# Patient Record
Sex: Female | Born: 1970
Health system: Southern US, Community
[De-identification: ages and names within clinical notes are randomized; demographics above are authoritative.]

## PROBLEM LIST (undated history)

## (undated) DIAGNOSIS — E039 Hypothyroidism, unspecified: Secondary | ICD-10-CM

## (undated) DIAGNOSIS — M549 Dorsalgia, unspecified: Secondary | ICD-10-CM

## (undated) DIAGNOSIS — J45909 Unspecified asthma, uncomplicated: Secondary | ICD-10-CM

## (undated) DIAGNOSIS — J449 Chronic obstructive pulmonary disease, unspecified: Secondary | ICD-10-CM

## (undated) DIAGNOSIS — D219 Benign neoplasm of connective and other soft tissue, unspecified: Secondary | ICD-10-CM

## (undated) DIAGNOSIS — E079 Disorder of thyroid, unspecified: Secondary | ICD-10-CM

## (undated) DIAGNOSIS — R200 Anesthesia of skin: Secondary | ICD-10-CM

## (undated) DIAGNOSIS — R202 Paresthesia of skin: Secondary | ICD-10-CM

## (undated) DIAGNOSIS — F319 Bipolar disorder, unspecified: Secondary | ICD-10-CM

## (undated) DIAGNOSIS — M1990 Unspecified osteoarthritis, unspecified site: Secondary | ICD-10-CM

## (undated) DIAGNOSIS — R569 Unspecified convulsions: Secondary | ICD-10-CM

## (undated) DIAGNOSIS — F32A Depression, unspecified: Secondary | ICD-10-CM

## (undated) DIAGNOSIS — F419 Anxiety disorder, unspecified: Secondary | ICD-10-CM

## (undated) DIAGNOSIS — F329 Major depressive disorder, single episode, unspecified: Secondary | ICD-10-CM

## (undated) HISTORY — DX: Anesthesia of skin: R20.0

## (undated) HISTORY — DX: Bipolar disorder, unspecified: F31.9

## (undated) HISTORY — DX: Chronic obstructive pulmonary disease, unspecified: J44.9

## (undated) HISTORY — DX: Anesthesia of skin: R20.2

## (undated) HISTORY — DX: Disorder of thyroid, unspecified: E07.9

## (undated) HISTORY — DX: Anxiety disorder, unspecified: F41.9

## (undated) HISTORY — DX: Benign neoplasm of connective and other soft tissue, unspecified: D21.9

## (undated) HISTORY — PX: CERVICAL CONE BIOPSY: SUR198

---

## 2009-04-14 ENCOUNTER — Emergency Department (HOSPITAL_COMMUNITY): Admission: EM | Admit: 2009-04-14 | Discharge: 2009-04-14 | Payer: Self-pay | Admitting: Emergency Medicine

## 2009-06-07 ENCOUNTER — Emergency Department (HOSPITAL_COMMUNITY): Admission: EM | Admit: 2009-06-07 | Discharge: 2009-06-07 | Payer: Self-pay | Admitting: Emergency Medicine

## 2011-08-04 ENCOUNTER — Emergency Department (HOSPITAL_COMMUNITY)
Admission: EM | Admit: 2011-08-04 | Discharge: 2011-08-05 | Disposition: A | Discharge status: Home / Self Care | Payer: Medicaid Other | Attending: Emergency Medicine | Admitting: Emergency Medicine

## 2011-08-04 ENCOUNTER — Encounter (HOSPITAL_COMMUNITY): Payer: Self-pay | Admitting: *Deleted

## 2011-08-04 DIAGNOSIS — T50902A Poisoning by unspecified drugs, medicaments and biological substances, intentional self-harm, initial encounter: Secondary | ICD-10-CM

## 2011-08-04 DIAGNOSIS — F329 Major depressive disorder, single episode, unspecified: Secondary | ICD-10-CM | POA: Insufficient documentation

## 2011-08-04 DIAGNOSIS — F3289 Other specified depressive episodes: Secondary | ICD-10-CM | POA: Insufficient documentation

## 2011-08-04 DIAGNOSIS — Y92009 Unspecified place in unspecified non-institutional (private) residence as the place of occurrence of the external cause: Secondary | ICD-10-CM | POA: Insufficient documentation

## 2011-08-04 DIAGNOSIS — T50992A Poisoning by other drugs, medicaments and biological substances, intentional self-harm, initial encounter: Secondary | ICD-10-CM | POA: Insufficient documentation

## 2011-08-04 DIAGNOSIS — T50991A Poisoning by other drugs, medicaments and biological substances, accidental (unintentional), initial encounter: Secondary | ICD-10-CM | POA: Insufficient documentation

## 2011-08-04 HISTORY — DX: Major depressive disorder, single episode, unspecified: F32.9

## 2011-08-04 HISTORY — DX: Depression, unspecified: F32.A

## 2011-08-04 HISTORY — DX: Unspecified asthma, uncomplicated: J45.909

## 2011-08-04 LAB — COMPREHENSIVE METABOLIC PANEL
ALT: 11 U/L (ref 0–35)
CO2: 23 mEq/L (ref 19–32)
Calcium: 9.5 mg/dL (ref 8.4–10.5)
GFR calc Af Amer: >90 mL/min (ref >90)
GFR calc non Af Amer: >90 mL/min (ref >90)
Glucose, Bld: 96 mg/dL (ref 70–99)
Sodium: 137 mEq/L (ref 135–145)

## 2011-08-04 LAB — URINALYSIS, ROUTINE W REFLEX MICROSCOPIC
Bilirubin Urine: NEGATIVE
Glucose, UA: NEGATIVE mg/dL
Hgb urine dipstick: NEGATIVE
Ketones, ur: NEGATIVE mg/dL
Leukocytes, UA: NEGATIVE
Nitrite: NEGATIVE
Protein, ur: NEGATIVE mg/dL
Specific Gravity, Urine: <1.005 — ABNORMAL LOW (ref 1.005–1.030)
Urobilinogen, UA: 0.2 mg/dL (ref 0.0–1.0)
pH: 6 (ref 5.0–8.0)

## 2011-08-04 LAB — ETHANOL: Alcohol, Ethyl (B): <11 mg/dL (ref 0–11)

## 2011-08-04 LAB — ACETAMINOPHEN LEVEL: Acetaminophen (Tylenol), Serum: <15 ug/mL (ref 10–30)

## 2011-08-04 LAB — PROTIME-INR: Prothrombin Time: 14.2 seconds (ref 11.6–15.2)

## 2011-08-04 LAB — DIFFERENTIAL
Eosinophils Absolute: 0.2 10*3/uL (ref 0.0–0.7)
Lymphs Abs: 3 10*3/uL (ref 0.7–4.0)
Monocytes Absolute: 0.3 10*3/uL (ref 0.1–1.0)
Monocytes Relative: 5 % (ref 3–12)
Neutro Abs: 2 10*3/uL (ref 1.7–7.7)
Neutrophils Relative %: 37 % — ABNORMAL LOW (ref 43–77)

## 2011-08-04 LAB — PREGNANCY, URINE: Preg Test, Ur: NEGATIVE

## 2011-08-04 LAB — RAPID URINE DRUG SCREEN, HOSP PERFORMED
Barbiturates: NOT DETECTED
Cocaine: NOT DETECTED

## 2011-08-04 LAB — SALICYLATE LEVEL: Salicylate Lvl: 8.1 mg/dL (ref 2.8–20.0)

## 2011-08-04 LAB — CBC
HCT: 41.9 % (ref 36.0–46.0)
WBC: 5.6 10*3/uL (ref 4.0–10.5)

## 2011-08-04 LAB — APTT: aPTT: 27 s (ref 24–37)

## 2011-08-04 NOTE — ED Provider Notes (Signed)
History     CSN: [id number]  Arrival date & time 08/04/11  1639   First MD Initiated Contact with Patient 08/04/11 1650      Chief Complaint  Patient presents with  . Medical Clearance     HPI Pt was seen at 1705.   Per pt, c/o sudden onset and resolution of one episode of OD on meds PTA.  Pt states her "daughter got to me" and "I got upset," so she drank 2 beers, took OTC motrin 200mg  tabs approx 6-10 tabs and OTC excedrin migraine approx 6 tablets.  States she "immediately realized what I just did" and "made myself throw them all up."  States she believes "it was a stupid, embarrassing thing to do."  Denies hx of SI or SA previously.  Does endorse Hx anxiety and depression, sees a Theatre manager at [facility].  Denies SI currently, no HI, no abd pain, no CP/SOB.     Past Medical History  Diagnosis Date  . Asthma   . Depression     History reviewed. No pertinent past surgical history.   History  Substance Use Topics  . Smoking status: Current Everyday Smoker  . Smokeless tobacco: Not on file  . Alcohol Use: Yes    Review of Systems ROS: Statement: All systems negative except as marked or noted in the HPI; Constitutional: Negative for fever and chills. ; ; Eyes: Negative for eye pain, redness and discharge. ; ; ENMT: Negative for ear pain, hoarseness, nasal congestion, sinus pressure and sore throat. ; ; Cardiovascular: Negative for chest pain, palpitations, diaphoresis, dyspnea and peripheral edema. ; ; Respiratory: Negative for cough, wheezing and stridor. ; ; Gastrointestinal: Negative for nausea, vomiting, diarrhea, abdominal pain, blood in stool, hematemesis, jaundice and rectal bleeding. . ; ; Genitourinary: Negative for dysuria, flank pain and hematuria. ; ; Musculoskeletal: Negative for back pain and neck pain. Negative for swelling and trauma.; ; Skin: Negative for pruritus, rash, abrasions, blisters, bruising and skin lesion.; ; Neuro: Negative for headache,  lightheadedness and neck stiffness. Negative for weakness, altered level of consciousness , altered mental status, extremity weakness, paresthesias, involuntary movement, seizure and syncope.; Psych:  +depression/anxiety, no HI, no hallucinations.     Allergies  Review of patient's allergies indicates no known allergies.  Home Medications  No current outpatient prescriptions on file.  BP 144/90  Pulse 95  Temp 98.7 F (37.1 C) (Oral)  Resp 20  Ht 5\' 8"  (1.727 m)  Wt 200 lb (90.719 kg)  BMI 30.41 kg/m2  SpO2 96%  Physical Exam 1710: Physical examination:  Nursing notes reviewed; Vital signs and O2 SAT reviewed;  Constitutional: Well developed, Well nourished, Well hydrated, In no acute distress; Head:  Normocephalic, atraumatic; Eyes: EOMI, PERRL, No scleral icterus; ENMT: Mouth and pharynx normal, Mucous membranes moist; Neck: Supple, Full range of motion, No lymphadenopathy; Cardiovascular: Regular rate and rhythm, No murmur, rub, or gallop; Respiratory: Breath sounds clear & equal bilaterally, No rales, rhonchi, wheezes.  Speaking full sentences with ease, Normal respiratory effort/excursion; Chest: Nontender, Movement normal;; Extremities: Pulses normal, No tenderness, No edema, No calf edema or asymmetry.; Neuro: AA&Ox3, Major CN grossly intact.  Speech clear. No gross focal motor or sensory deficits in extremities.; Skin: Color normal, Warm, Dry; Psych:  Full affect, denies SI.    ED Course  Procedures   MDM  MDM Reviewed: nursing note and vitals Interpretation: labs     Results for orders placed during the hospital encounter of 08/04/11  CBC      Component Value Range   WBC 5.6  4.0 - 10.5 K/uL   RBC 4.68  3.87 - 5.11 MIL/uL   Hemoglobin 13.7  12.0 - 15.0 g/dL   HCT 29.5  28.4 - 13.2 %   MCV 89.5  78.0 - 100.0 fL   MCH 29.3  26.0 - 34.0 pg   MCHC 32.7  30.0 - 36.0 g/dL   RDW 44.0  10.2 - 72.5 %   Platelets 235  150 - 400 K/uL  DIFFERENTIAL      Component Value  Range   Neutrophils Relative 37 (*) 43 - 77 %   Neutro Abs 2.0  1.7 - 7.7 K/uL   Lymphocytes Relative 54 (*) 12 - 46 %   Lymphs Abs 3.0  0.7 - 4.0 K/uL   Monocytes Relative 5  3 - 12 %   Monocytes Absolute 0.3  0.1 - 1.0 K/uL   Eosinophils Relative 4  0 - 5 %   Eosinophils Absolute 0.2  0.0 - 0.7 K/uL   Basophils Relative 0  0 - 1 %   Basophils Absolute 0.0  0.0 - 0.1 K/uL  COMPREHENSIVE METABOLIC PANEL      Component Value Range   Sodium 137  135 - 145 mEq/L   Potassium 3.7  3.5 - 5.1 mEq/L   Chloride 101  96 - 112 mEq/L   CO2 23  19 - 32 mEq/L   Glucose, Bld 96  70 - 99 mg/dL   BUN 11  6 - 23 mg/dL   Creatinine, Ser 3.66  0.50 - 1.10 mg/dL   Calcium 9.5  8.4 - 44.0 mg/dL   Total Protein 7.3  6.0 - 8.3 g/dL   Albumin 3.5  3.5 - 5.2 g/dL   AST 23  0 - 37 U/L   ALT 11  0 - 35 U/L   Alkaline Phosphatase 77  39 - 117 U/L   Total Bilirubin 0.2 (*) 0.3 - 1.2 mg/dL   GFR calc non Af Amer >90  >90 mL/min   GFR calc Af Amer >90  >90 mL/min  ACETAMINOPHEN LEVEL      Component Value Range   Acetaminophen (Tylenol), Serum <15.0  10 - 30 ug/mL  URINE RAPID DRUG SCREEN (HOSP PERFORMED)      Component Value Range   Opiates NONE DETECTED  NONE DETECTED   Cocaine NONE DETECTED  NONE DETECTED   Benzodiazepines NONE DETECTED  NONE DETECTED   Amphetamines NONE DETECTED  NONE DETECTED   Tetrahydrocannabinol NONE DETECTED  NONE DETECTED   Barbiturates NONE DETECTED  NONE DETECTED  ETHANOL      Component Value Range   Alcohol, Ethyl (B) <11  0 - 11 mg/dL  PREGNANCY, URINE      Component Value Range   Preg Test, Ur NEGATIVE  NEGATIVE  URINALYSIS, ROUTINE W REFLEX MICROSCOPIC      Component Value Range   Color, Urine YELLOW  YELLOW   APPearance CLEAR  CLEAR   Specific Gravity, Urine <1.005 (*) 1.005 - 1.030   pH 6.0  5.0 - 8.0   Glucose, UA NEGATIVE  NEGATIVE mg/dL   Hgb urine dipstick NEGATIVE  NEGATIVE   Bilirubin Urine NEGATIVE  NEGATIVE   Ketones, ur NEGATIVE  NEGATIVE mg/dL    Protein, ur NEGATIVE  NEGATIVE mg/dL   Urobilinogen, UA 0.2  0.0 - 1.0 mg/dL   Nitrite NEGATIVE  NEGATIVE   Leukocytes, UA NEGATIVE  NEGATIVE  SALICYLATE  LEVEL      Component Value Range   Salicylate Lvl 8.1  2.8 - 20.0 mg/dL  PROTIME-INR      Component Value Range   Prothrombin Time 14.2  11.6 - 15.2 seconds   INR 1.08  0.00 - 1.49  APTT      Component Value Range   aPTT 27  24 - 37 seconds      2210:  Pt continues calm/cooperative.  4 hour labs (etoh, APAP, ASA) normal.  Pt did not take extended release or enteric coated preparations, though will order re-check of APAP and ASA in 2 hours for trending.  Will have telepsych eval for likely d/c.  Pt agreeable with plan. Sign out to night shift.             Laray Anger, DO 08/07/11 1454

## 2011-08-04 NOTE — ED Notes (Signed)
Dr. Clarene Duke in to speak with patient and explain reason for lab draws at 21:00.  Patient acknowledged explanation.  Food offered- meal tray accepted.  Patient calm and cooperative at this time.

## 2011-08-04 NOTE — ED Notes (Addendum)
Pt calm and cooperative at present. Pt states that she did not want to harm herself only wanted her headache to go away

## 2011-08-04 NOTE — ED Notes (Signed)
Took excedrin migraine   6 tablets and   Motrin 200mg  ,   6-10.  Drank  2 beers

## 2011-08-05 MED ORDER — PENICILLIN V POTASSIUM 500 MG PO TABS: 500.0000 mg | ORAL_TABLET | Freq: Four times a day (QID) | ORAL | Status: AC

## 2011-08-05 NOTE — ED Notes (Signed)
Consult completed.  Patient requested gingerale.   Provided.  Is coughing frequently. States she usually uses an inhaler but unable to find it.

## 2011-08-05 NOTE — ED Notes (Signed)
Telepsych consult in progress

## 2011-08-05 NOTE — Discharge Instructions (Signed)
Follow up with your doctor regarding your depression.

## 2011-08-05 NOTE — ED Notes (Signed)
Patient requested antibiotic for infected tooth - that is one of the causes of her pain which initiated her taking OTC pain meds (and other family issues)  Dr Colon Branch informed.

## 2011-08-05 NOTE — Progress Notes (Signed)
1610 Nursing advised that patient is asking for antibiotic for tooth that has become infected. Patient with lower right tooth with swollen gums, no pointed abscess.  Rx Pen VK #[phone]

## 2011-08-05 NOTE — Progress Notes (Signed)
28 Spoke with Dr. Rosette Reveal, psychiatrist with telepsych. He will evaluate patient. 1610 Report from Dr. Rosette Reveal. He has assessed patient and feels she can be discharged home safely.

## 2012-11-16 ENCOUNTER — Encounter (HOSPITAL_COMMUNITY): Payer: Self-pay | Admitting: *Deleted

## 2012-11-16 ENCOUNTER — Emergency Department (HOSPITAL_COMMUNITY)
Admission: EM | Admit: 2012-11-16 | Discharge: 2012-11-16 | Disposition: A | Discharge status: Home / Self Care | Payer: Medicaid Other | Attending: Emergency Medicine | Admitting: Emergency Medicine

## 2012-11-16 DIAGNOSIS — F17200 Nicotine dependence, unspecified, uncomplicated: Secondary | ICD-10-CM | POA: Insufficient documentation

## 2012-11-16 DIAGNOSIS — F3289 Other specified depressive episodes: Secondary | ICD-10-CM | POA: Insufficient documentation

## 2012-11-16 DIAGNOSIS — Z30432 Encounter for removal of intrauterine contraceptive device: Secondary | ICD-10-CM | POA: Insufficient documentation

## 2012-11-16 DIAGNOSIS — Z79899 Other long term (current) drug therapy: Secondary | ICD-10-CM | POA: Insufficient documentation

## 2012-11-16 DIAGNOSIS — R109 Unspecified abdominal pain: Secondary | ICD-10-CM

## 2012-11-16 DIAGNOSIS — J45909 Unspecified asthma, uncomplicated: Secondary | ICD-10-CM | POA: Insufficient documentation

## 2012-11-16 DIAGNOSIS — F329 Major depressive disorder, single episode, unspecified: Secondary | ICD-10-CM | POA: Insufficient documentation

## 2012-11-16 DIAGNOSIS — IMO0002 Reserved for concepts with insufficient information to code with codable children: Secondary | ICD-10-CM | POA: Insufficient documentation

## 2012-11-16 LAB — URINALYSIS, ROUTINE W REFLEX MICROSCOPIC
Bilirubin Urine: NEGATIVE
Glucose, UA: NEGATIVE mg/dL
Ketones, ur: NEGATIVE mg/dL
Leukocytes, UA: NEGATIVE
Nitrite: NEGATIVE
Protein, ur: NEGATIVE mg/dL
Specific Gravity, Urine: >1.03 — ABNORMAL HIGH (ref 1.005–1.030)
Urobilinogen, UA: 0.2 mg/dL (ref 0.0–1.0)
pH: 6 (ref 5.0–8.0)

## 2012-11-16 LAB — URINE MICROSCOPIC-ADD ON

## 2012-11-16 MED ORDER — NAPROXEN 500 MG PO TABS: 500.0000 mg | ORAL_TABLET | Freq: Two times a day (BID) | ORAL | Status: DC

## 2012-11-16 NOTE — ED Notes (Signed)
Pt states she needs IUD removed because it is time to come and states the IUD is causing abdominal pain. [facility] but they stated they were unable to take the IUD out for her.

## 2012-11-16 NOTE — ED Provider Notes (Signed)
CSN: [id number]     Arrival date & time 11/16/12  1306 History   First MD Initiated Contact with Patient 11/16/12 1324     Chief Complaint  Patient presents with  . IUD removal    (Consider location/radiation/quality/duration/timing/severity/associated sxs/prior Treatment) HPI Comments: 42 year old female who states that she has had an IUD for over 5 years, states that at this time for it to come out and has been unable to have a physician see her to do this since moving here from [place]. She describes approximately one month of lower abdominal discomfort which is intermittent, seems to be better when she holds her son.  She denies fevers chills dysuria but has had intermittent diarrhea and constipation.  The history is provided by the patient.    Past Medical History  Diagnosis Date  . Asthma   . Depression    History reviewed. No pertinent past surgical history. No family history on file. History  Substance Use Topics  . Smoking status: Current Every Day Smoker  . Smokeless tobacco: Not on file  . Alcohol Use: Yes   OB History   Grav Para Term Preterm Abortions TAB SAB Ect Mult Living                 Review of Systems  All other systems reviewed and are negative.    Allergies  Chantix  Home Medications   Current Outpatient Rx  Name  Route  Sig  Dispense  Refill  . albuterol (PROAIR HFA) 108 (90 BASE) MCG/ACT inhaler   Inhalation   Inhale 2 puffs into the lungs every 6 (six) hours as needed.         . ALPRAZolam (XANAX) 0.5 MG tablet   Oral   Take 0.5 mg by mouth 4 (four) times daily as needed.         . beclomethasone (QVAR) 40 MCG/ACT inhaler   Inhalation   Inhale 2 puffs into the lungs 2 (two) times daily.         . Chlorphen-Phenyleph-ASA (ALKA-SELTZER PLUS COLD PO)   Oral   Take 2 capsules by mouth at bedtime as needed (for allergies).         Marland Kitchen PARoxetine (PAXIL) 20 MG tablet   Oral   Take 20 mg by mouth every morning.         .  naproxen (NAPROSYN) 500 MG tablet   Oral   Take 1 tablet (500 mg total) by mouth 2 (two) times daily with a meal.   30 tablet   0    BP 146/77  Pulse 94  Temp(Src) 98.9 F (37.2 C) (Oral)  Resp 18  Ht 5\' 8"  (1.727 m)  Wt 210 lb (95.255 kg)  BMI 31.94 kg/m2  SpO2 95%  LMP 09/16/2012 Physical Exam  Nursing note and vitals reviewed. Constitutional: She appears well-developed and well-nourished. No distress.  HENT:  Head: Normocephalic and atraumatic.  Mouth/Throat: Oropharynx is clear and moist. No oropharyngeal exudate.  Eyes: Conjunctivae and EOM are normal. Pupils are equal, round, and reactive to light. Right eye exhibits no discharge. Left eye exhibits no discharge. No scleral icterus.  Neck: Normal range of motion. Neck supple. No JVD present. No thyromegaly present.  Cardiovascular: Normal rate, regular rhythm, normal heart sounds and intact distal pulses.  Exam reveals no gallop and no friction rub.   No murmur heard. Pulmonary/Chest: Effort normal and breath sounds normal. No respiratory distress. She has no wheezes. She has no rales.  Abdominal:  Soft. Bowel sounds are normal. She exhibits no distension and no mass. There is no tenderness.  Genitourinary:  Chaperone present for pelvic exam:  Normal appearing external genitalia, no vaginal discharge, no vaginal bleeding, no cervical motion tenderness, no adnexal tenderness or masses. IUD string is present coming from the cervix.  Musculoskeletal: Normal range of motion. She exhibits no edema and no tenderness.  Lymphadenopathy:    She has no cervical adenopathy.  Neurological: She is alert. Coordination normal.  Skin: Skin is warm and dry. No rash noted. No erythema.  Psychiatric: She has a normal mood and affect. Her behavior is normal.    ED Course  Procedures (including critical care time) Labs Review Labs Reviewed  URINALYSIS, ROUTINE W REFLEX MICROSCOPIC - Abnormal; Notable for the following:    Specific Gravity,  Urine >1.030 (*)    Hgb urine dipstick TRACE (*)    All other components within normal limits  URINE MICROSCOPIC-ADD ON - Abnormal; Notable for the following:    Squamous Epithelial / LPF FEW (*)    All other components within normal limits   Imaging Review No results found.  MDM   1. Abdominal pain    The patient is well appearing with normal vital signs, afebrile, no tachycardia and a soft nontender abdomen with no surgical signs.  The patient requests IUD removal. I've explained to her at length that this is not an emergency Department procedure but that we can do it in cases where he cannot be done at other times. She has expressed her understanding and agrees that she wants to move forward with this despite the risks.  Procedure note: IUD removal  Risks benefits and alternatives of the procedure were splinted the patient, she expresses her verbal understanding and excepts the risks.  Patient placed in the supine position, under speculum exam IUD removed with ring forceps, small amount of post removal bleeding, patient tolerated procedure without difficulty.  Urinalysis pending to evaluate for infection.    Vida Roller, MD 11/16/12 [phone]

## 2012-11-16 NOTE — ED Notes (Signed)
EDP to room for initial evaluation and assessment.

## 2013-02-13 ENCOUNTER — Encounter (HOSPITAL_COMMUNITY): Payer: Self-pay | Admitting: Emergency Medicine

## 2013-02-13 ENCOUNTER — Emergency Department (HOSPITAL_COMMUNITY)
Admission: EM | Admit: 2013-02-13 | Discharge: 2013-02-13 | Disposition: A | Discharge status: Home / Self Care | Payer: Medicaid Other | Attending: Emergency Medicine | Admitting: Emergency Medicine

## 2013-02-13 DIAGNOSIS — L02411 Cutaneous abscess of right axilla: Secondary | ICD-10-CM

## 2013-02-13 DIAGNOSIS — IMO0002 Reserved for concepts with insufficient information to code with codable children: Secondary | ICD-10-CM | POA: Insufficient documentation

## 2013-02-13 DIAGNOSIS — Z792 Long term (current) use of antibiotics: Secondary | ICD-10-CM | POA: Insufficient documentation

## 2013-02-13 DIAGNOSIS — Z791 Long term (current) use of non-steroidal anti-inflammatories (NSAID): Secondary | ICD-10-CM | POA: Insufficient documentation

## 2013-02-13 DIAGNOSIS — Z79899 Other long term (current) drug therapy: Secondary | ICD-10-CM | POA: Insufficient documentation

## 2013-02-13 DIAGNOSIS — F17200 Nicotine dependence, unspecified, uncomplicated: Secondary | ICD-10-CM | POA: Insufficient documentation

## 2013-02-13 DIAGNOSIS — F3289 Other specified depressive episodes: Secondary | ICD-10-CM | POA: Insufficient documentation

## 2013-02-13 DIAGNOSIS — F329 Major depressive disorder, single episode, unspecified: Secondary | ICD-10-CM | POA: Insufficient documentation

## 2013-02-13 DIAGNOSIS — J45909 Unspecified asthma, uncomplicated: Secondary | ICD-10-CM | POA: Insufficient documentation

## 2013-02-13 MED ORDER — OXYCODONE-ACETAMINOPHEN 5-325 MG PO TABS: 1.0000 | ORAL_TABLET | Freq: Four times a day (QID) | ORAL | Status: DC | PRN

## 2013-02-13 MED ORDER — HYDROMORPHONE HCL PF 1 MG/ML IJ SOLN
1.0000 mg | Freq: Once | INTRAMUSCULAR | Status: AC
  Administered 2013-02-13: 1 mg via INTRAMUSCULAR
  Filled 2013-02-13: qty 1

## 2013-02-13 MED ORDER — LIDOCAINE-EPINEPHRINE (PF) 2 %-1:200000 IJ SOLN
INTRAMUSCULAR | Status: AC
  Administered 2013-02-13: 20 mL
  Filled 2013-02-13: qty 20

## 2013-02-13 MED ORDER — DOXYCYCLINE HYCLATE 100 MG PO CAPS: 100.0000 mg | ORAL_CAPSULE | Freq: Two times a day (BID) | ORAL | Status: DC

## 2013-02-13 NOTE — ED Notes (Signed)
Pt ambulating independently w/ steady gait on d/c in no acute distress, A&Ox4. D/c instructions reviewed w/ pt and family - pt and family deny any further questions or concerns at present. Rx given x2  

## 2013-02-13 NOTE — ED Notes (Signed)
Pt reports noting a lump under her right axilla x2 weeks ago - area has become enlarged and is quite painful and extremely tender to touch. Pt denies any n/v, fever or chills.

## 2013-02-13 NOTE — ED Notes (Signed)
Onset of skin infection two weeks ago under right arm, worse tonight - feels very "tight and painful"   Took a hot bath and put "baby oil on it but it still hurts.  Also have one under left arm but it is getting better

## 2013-02-13 NOTE — ED Provider Notes (Addendum)
CSN: [id number]     Arrival date & time 02/13/13  2047 History   First MD Initiated Contact with Patient 02/13/13 2134 This chart was scribed for [employer]. Rubin Payor, MD by Elveria Rising, ED scribe.  This patient was seen in room APA19/APA19 and the patient's care was started at 9:42 PM.   Chief Complaint  Patient presents with  . Recurrent Skin Infections    under arm and painful    The history is provided by the patient. No language interpreter was used.   HPI Comments: Dawn Carter is a 42 y.o. female who presents to the Emergency Department w/hx of recurrent abscesses complaining of constant, gradually worsening abscess, right axilla, onset two weeks ago. The pain was previously tolerable, but worsened tonight. Patient attempted to treat with a warm compress, but it has been ineffective. Pain severity worsens on exertion. Patient denies fever. Patient also has abscess to left axilla that is healing. Patient denies diabetes. Patient is asthmatic. Patient has not been taking steroids.   Past Medical History  Diagnosis Date  . Asthma   . Depression    History reviewed. No pertinent past surgical history. No family history on file. History  Substance Use Topics  . Smoking status: Current Every Day Smoker  . Smokeless tobacco: Not on file  . Alcohol Use: Yes     Comment: beer   OB History   Grav Para Term Preterm Abortions TAB SAB Ect Mult Living                 Review of Systems  Constitutional: Negative for fever and chills.  HENT: Negative for congestion and rhinorrhea.   Respiratory: Negative for cough and shortness of breath.   Cardiovascular: Negative for chest pain.  Gastrointestinal: Negative for nausea and abdominal pain.  Musculoskeletal: Negative for back pain.  Skin: Positive for wound.       Abscess to right axilla.   Neurological: Negative for syncope.  All other systems reviewed and are negative.   A complete 10 system review of systems was obtained and  all systems are negative except as noted in the HPI and PMH.   Allergies  Chantix  Home Medications   Current Outpatient Rx  Name  Route  Sig  Dispense  Refill  . albuterol (PROAIR HFA) 108 (90 BASE) MCG/ACT inhaler   Inhalation   Inhale 2 puffs into the lungs every 6 (six) hours as needed for wheezing or shortness of breath.          . ALPRAZolam (XANAX) 1 MG tablet   Oral   Take 1 mg by mouth 3 (three) times daily as needed for anxiety.         . beclomethasone (QVAR) 40 MCG/ACT inhaler   Inhalation   Inhale 1 puff into the lungs 2 (two) times daily.          Marland Kitchen ibuprofen (ADVIL,MOTRIN) 200 MG tablet   Oral   Take 200 mg by mouth daily as needed for headache, mild pain, moderate pain or cramping.         . naproxen (NAPROSYN) 500 MG tablet   Oral   Take 1 tablet (500 mg total) by mouth 2 (two) times daily with a meal.   30 tablet   0   . naproxen (NAPROSYN) 500 MG tablet   Oral   Take 500-1,000 mg by mouth daily as needed for mild pain or moderate pain (for pain).         Marland Kitchen  Phenyleph-CPM-DM-Aspirin (ALKA-SELTZER PLUS COLD & COUGH) 7.09-16-08-325 MG TBEF   Oral   Take 1 tablet by mouth daily as needed (for cold symptoms).         Marland Kitchen doxycycline (VIBRAMYCIN) 100 MG capsule   Oral   Take 1 capsule (100 mg total) by mouth 2 (two) times daily.   14 capsule   0   . oxyCODONE-acetaminophen (PERCOCET/ROXICET) 5-325 MG per tablet   Oral   Take 1-2 tablets by mouth every 6 (six) hours as needed for severe pain.   15 tablet   0    Triage Vitals: BP 148/57  Pulse 87  Temp(Src) 98.3 F (36.8 C) (Oral)  Resp 20  Ht 5\' 7"  (1.702 m)  Wt 224 lb (101.606 kg)  BMI 35.08 kg/m2  SpO2 98%  LMP 02/12/2013 Physical Exam  Nursing note and vitals reviewed. Constitutional: She is oriented to person, place, and time. She appears well-developed and well-nourished. No distress.  HENT:  Head: Normocephalic and atraumatic.  Eyes: Conjunctivae are normal. Right eye  exhibits no discharge. Left eye exhibits no discharge.  Neck: Normal range of motion.  Cardiovascular: Normal rate, regular rhythm and normal heart sounds.   No murmur heard. Pulmonary/Chest: Effort normal and breath sounds normal. No respiratory distress. She has no wheezes. She has no rales.  Abdominal: Soft. Bowel sounds are normal. She exhibits no distension. There is no tenderness.  Musculoskeletal: Normal range of motion. She exhibits no edema.  Neurological: She is alert and oriented to person, place, and time.  Skin: Skin is warm and dry.  7cm x 3cm abscess right axilla, foul smelling drainage, tenderness on palpation, pain with movement of right shoulder.   Healing to left axilla, without tenderness  Psychiatric: She has a normal mood and affect. Thought content normal.    ED Course  Procedures (including critical care time) DIAGNOSTIC STUDIES: Oxygen Saturation is 98% on room air, normal by my interpretation.    COORDINATION OF CARE: 10:45 PM- Discussed plans of injection and pain medication. Pt advised of plan for treatment and pt agrees.  Labs Review Labs Reviewed - No data to display Imaging Review No results found.  EKG Interpretation   None      MDM   1. Abscess of axilla, right    Patient with right axillary abscess. Drained with moderate purulent drainage. Patient refused any packing. She also refused further opening up the initial incision. We'll treat with antibiotics and pain medicines. She'll followup with her primary care doctor in a few days. She will soak the abscess and attempt to express further purulence at home.   I personally performed the services described in this documentation, which was scribed in my presence. The recorded information has been reviewed and is accurate.   INCISION AND DRAINAGE Performed by: Billee Cashing. Consent: Verbal consent obtained. Risks and benefits: risks, benefits and alternatives were discussed Type:  abscess  Body area: Right axilla  Anesthesia: local infiltration  Incision was made with a scalpel.  Local anesthetic: lidocaine 2% with epinephrine  Anesthetic total: 20 ml including irrigation of wound.  Complexity: complex Blunt dissection to break up loculations  Drainage: purulent  Drainage amount: Moderate   Packing material: Refused   Patient tolerance: Patient tolerated the procedure well with no immediate complications.     Juliet Rude. Rubin Payor, MD 02/13/13 2246  Juliet Rude. Rubin Payor, MD 02/13/13 2252

## 2013-03-05 ENCOUNTER — Emergency Department (HOSPITAL_COMMUNITY)
Admission: EM | Admit: 2013-03-05 | Discharge: 2013-03-06 | Disposition: A | Discharge status: Home / Self Care | Payer: Medicaid Other | Attending: Emergency Medicine | Admitting: Emergency Medicine

## 2013-03-05 ENCOUNTER — Encounter (HOSPITAL_COMMUNITY): Payer: Self-pay | Admitting: Emergency Medicine

## 2013-03-05 DIAGNOSIS — Z79899 Other long term (current) drug therapy: Secondary | ICD-10-CM | POA: Insufficient documentation

## 2013-03-05 DIAGNOSIS — Z791 Long term (current) use of non-steroidal anti-inflammatories (NSAID): Secondary | ICD-10-CM | POA: Insufficient documentation

## 2013-03-05 DIAGNOSIS — F17200 Nicotine dependence, unspecified, uncomplicated: Secondary | ICD-10-CM | POA: Insufficient documentation

## 2013-03-05 DIAGNOSIS — J45901 Unspecified asthma with (acute) exacerbation: Secondary | ICD-10-CM | POA: Insufficient documentation

## 2013-03-05 DIAGNOSIS — J189 Pneumonia, unspecified organism: Secondary | ICD-10-CM

## 2013-03-05 DIAGNOSIS — Z792 Long term (current) use of antibiotics: Secondary | ICD-10-CM | POA: Insufficient documentation

## 2013-03-05 DIAGNOSIS — IMO0002 Reserved for concepts with insufficient information to code with codable children: Secondary | ICD-10-CM | POA: Insufficient documentation

## 2013-03-05 DIAGNOSIS — M549 Dorsalgia, unspecified: Secondary | ICD-10-CM | POA: Insufficient documentation

## 2013-03-05 DIAGNOSIS — F3289 Other specified depressive episodes: Secondary | ICD-10-CM | POA: Insufficient documentation

## 2013-03-05 DIAGNOSIS — F329 Major depressive disorder, single episode, unspecified: Secondary | ICD-10-CM | POA: Insufficient documentation

## 2013-03-05 DIAGNOSIS — J159 Unspecified bacterial pneumonia: Secondary | ICD-10-CM | POA: Insufficient documentation

## 2013-03-05 MED ORDER — IBUPROFEN 400 MG PO TABS
600.0000 mg | ORAL_TABLET | Freq: Once | ORAL | Status: AC
  Administered 2013-03-06: 600 mg via ORAL
  Filled 2013-03-05: qty 2

## 2013-03-05 NOTE — ED Provider Notes (Signed)
CSN: [id number]     Arrival date & time 03/05/13  2336 History  This chart was scribed for Virgel Manifold, MD by Anastasia Pall, ED Scribe. This patient was seen in room [facility]18/[facility]18 and the patient's care was started at 11:45 PM.    Chief Complaint  Patient presents with  . Asthma  . Shortness of Breath  . Back Pain    The history is provided by the patient. No language interpreter was used.   HPI Comments: Dawn Carter is a 43 y.o. female who presents to the Emergency Department complaining of asthma, SOB, and constant, left sided back pain around her shoulder blade, onset a few hours PTA while she was talking to her sister on the phone. She denies any recent trauma to her back, but states the pain feels like she has pulled a muscle. She reports that coughing, wheezing, deep breathing exacerbates her pain. She reports h/o similar pain about 10 years ago, when she was diagnosed with pneumonia. She reports having recent cold, with coughing, sneezing symptoms. She reports she woke up sweaty this morning. She reports taking Alka-seltzer and her Albuterol for her symptoms with little relief. She reports h/o asthma, that she takes albuterol for. She states she is on O2 at home. She states she is on anti-depressant. She denies fever, chills, LE pain and swelling, and any other associated symptoms. She denies h/o MI, DVT, recent surgeries, long trips.  PCP - No PCP Per Patient  Past Medical History  Diagnosis Date  . Asthma   . Depression    No past surgical history on file. No family history on file. History  Substance Use Topics  . Smoking status: Current Every Day Smoker  . Smokeless tobacco: Not on file  . Alcohol Use: Yes     Comment: beer   OB History   Grav Para Term Preterm Abortions TAB SAB Ect Mult Living                 Review of Systems  All other systems reviewed and are negative.   Allergies  Chantix  Home Medications   Current Outpatient Rx  Name  Route  Sig   Dispense  Refill  . albuterol (PROAIR HFA) 108 (90 BASE) MCG/ACT inhaler   Inhalation   Inhale 2 puffs into the lungs every 6 (six) hours as needed for wheezing or shortness of breath.          . ALPRAZolam (XANAX) 1 MG tablet   Oral   Take 1 mg by mouth 3 (three) times daily as needed for anxiety.         . beclomethasone (QVAR) 40 MCG/ACT inhaler   Inhalation   Inhale 1 puff into the lungs 2 (two) times daily.          Marland Kitchen doxycycline (VIBRAMYCIN) 100 MG capsule   Oral   Take 1 capsule (100 mg total) by mouth 2 (two) times daily.   14 capsule   0   . ibuprofen (ADVIL,MOTRIN) 200 MG tablet   Oral   Take 200 mg by mouth daily as needed for headache, mild pain, moderate pain or cramping.         . naproxen (NAPROSYN) 500 MG tablet   Oral   Take 1 tablet (500 mg total) by mouth 2 (two) times daily with a meal.   30 tablet   0   . naproxen (NAPROSYN) 500 MG tablet   Oral   Take 500-1,000 mg by  mouth daily as needed for mild pain or moderate pain (for pain).         Marland Kitchen oxyCODONE-acetaminophen (PERCOCET/ROXICET) 5-325 MG per tablet   Oral   Take 1-2 tablets by mouth every 6 (six) hours as needed for severe pain.   15 tablet   0   . Phenyleph-CPM-DM-Aspirin (ALKA-SELTZER PLUS COLD & COUGH) 7.09-16-08-325 MG TBEF   Oral   Take 1 tablet by mouth daily as needed (for cold symptoms).          BP 136/73  Pulse 101  Temp(Src) 98.5 F (36.9 C) (Oral)  Resp 24  Ht 5\' 8"  (1.727 m)  Wt 224 lb (101.606 kg)  BMI 34.07 kg/m2  SpO2 90%  LMP 02/12/2013  Physical Exam  Nursing note and vitals reviewed. Constitutional: She appears well-developed and well-nourished. No distress.  HENT:  Head: Normocephalic and atraumatic.  Eyes: Conjunctivae are normal. No scleral icterus.  Neck: Normal range of motion.  Cardiovascular: Normal rate, regular rhythm and normal heart sounds.   Pulmonary/Chest: Effort normal. No respiratory distress. She has no wheezes. She has rhonchi  (bilateral upper lobes). She has no rales. She exhibits no tenderness.  Shallow respiration/splinting.   Abdominal: Soft. Bowel sounds are normal. She exhibits no distension and no mass. There is no tenderness. There is no rebound and no guarding.  Musculoskeletal: Normal range of motion. She exhibits no edema and no tenderness.  No calf tenderness, no edema.   Neurological: She is alert.  Skin: Skin is warm and dry. She is not diaphoretic.    ED Course  Procedures (including critical care time)  DIAGNOSTIC STUDIES: Oxygen Saturation is 90% on room air, adequate by my interpretation.    COORDINATION OF CARE: 11:50 PM-Discussed treatment plan which includes Ibuprofen, CXR, CBC, BMP, Saline lock IV, and EKG with pt at bedside and pt agreed to plan.   Labs Review Labs Reviewed  BASIC METABOLIC PANEL - Abnormal; Notable for the following:    Sodium 136 (*)    Glucose, Bld 115 (*)    All other components within normal limits  D-DIMER, QUANTITATIVE - Abnormal; Notable for the following:    D-Dimer, Quant 0.65 (*)    All other components within normal limits  CBC WITH DIFFERENTIAL   Imaging Review Dg Chest 2 View  03/06/2013   CLINICAL DATA:  Back pain, shortness of breath.  EXAM: CHEST  2 VIEW  COMPARISON:  Chest radiograph December 12, 2009  FINDINGS: Cardiomediastinal silhouette is unremarkable and unchanged. Bilateral perihilar peribronchial cuffing, with mild perihilar interstitial prominence. No pleural effusions or focal consolidations. No pneumothorax.  Straightened thoracic kyphosis with lower thoracic dextroscoliosis. Soft tissue planes are nonsuspicious.  IMPRESSION: Worsening perihilar peribronchial cuffing suggesting bronchitis without pneumonia.   Electronically Signed   By: Elon Alas   On: 03/06/2013 00:44   Ct Angio Chest W/cm &/or Wo Cm  03/06/2013   CLINICAL DATA:  Left-sided back pain. History of asthma. Elevated D-dimer.  EXAM: CT ANGIOGRAPHY CHEST WITH CONTRAST   TECHNIQUE: Multidetector CT imaging of the chest was performed using the standard protocol during bolus administration of intravenous contrast. Multiplanar CT image reconstructions including MIPs were obtained to evaluate the vascular anatomy.  CONTRAST:  111mL OMNIPAQUE IOHEXOL 350 MG/ML SOLN  COMPARISON:  Chest radiograph performed earlier today at 12:10 a.m.  FINDINGS: There is no evidence of pulmonary embolus.  There is a mosaic pattern of parenchymal attenuation within the lungs, with additional patchy airspace opacities seen bilaterally. This  may reflect multifocal pneumonia, or possibly an underlying inflammatory condition. There is no evidence of pleural effusion or pneumothorax. No masses are identified; no abnormal focal contrast enhancement is seen.  Enlarged bilateral hilar and peribronchial nodes are seen; an enlarged azygoesophageal recess node is also noted, measuring 1.9 cm in short axis. This is more prominent than typically expected for pneumonia, and could reflect sarcoidosis or another inflammatory condition. No pericardial effusion is identified. The great vessels are grossly unremarkable in appearance. Incidental note is made of a direct origin of the left vertebral artery from the aortic arch. No axillary lymphadenopathy is seen. The visualized portions of the thyroid gland are unremarkable in appearance.  The visualized portions of the liver and spleen are unremarkable.  No acute osseous abnormalities are seen.  Review of the MIP images confirms the above findings.  IMPRESSION: 1. No evidence of pulmonary embolus. 2. Mosaic pattern and parenchymal attenuation within the lungs, with additional patchy airspace opacities seen bilaterally. This may represent multifocal pneumonia, possibly atypical in nature, or an underlying inflammatory condition. 3. Enlarged bilateral hilar and peribronchial nodes seen, with a significantly enlarged azygoesophageal recess node, measuring 1.9 cm in short axis.  This could reflect sarcoidosis or another inflammatory condition; they are more prominent than typically expected for pneumonia.   Electronically Signed   By: Garald Balding M.D.   On: 03/06/2013 02:57    EKG Interpretation    Date/Time:  Monday March 05 2013 23:54:57 EST Ventricular Rate:  94 PR Interval:  126 QRS Duration: 90 QT Interval:  344 QTC Calculation: 430 R Axis:   64 Text Interpretation:  Normal sinus rhythm RSR' or QR pattern in V1 suggests right ventricular conduction delay Borderline ECG No previous ECGs available ED PHYSICIAN INTERPRETATION AVAILABLE IN [facility] Confirmed by TEST, RECORD ([id number]) on 03/07/2013 8:12:59 AM           Medications  ibuprofen (ADVIL,MOTRIN) tablet 600 mg (600 mg Oral Given 03/06/13 0046)  predniSONE (DELTASONE) tablet 60 mg (60 mg Oral Given 03/06/13 0052)  albuterol (PROVENTIL) (2.5 MG/3ML) 0.083% nebulizer solution 5 mg (5 mg Nebulization Given 03/06/13 0131)  HYDROmorphone (DILAUDID) injection 1 mg (1 mg Intravenous Given 03/06/13 0128)  sodium chloride 0.9 % bolus 1,000 mL (0 mLs Intravenous Stopped 03/06/13 0533)  ondansetron (ZOFRAN) injection 4 mg (4 mg Intravenous Given 03/06/13 0128)  iohexol (OMNIPAQUE) 350 MG/ML injection 100 mL (100 mLs Intravenous Contrast Given 03/06/13 0217)  cefTRIAXone (ROCEPHIN) 1 g in dextrose 5 % 50 mL IVPB (0 g Intravenous Stopped 03/06/13 0353)    MDM   1. CAP (community acquired pneumonia)    33yF with SOB and back pain. CT as above. Will cover for possible CAP. Needs follow-up for possible inflammatory condition if symptoms persist despite tx. Return precautions discussed.   I personally preformed the services scribed in my presence. The recorded information has been reviewed is accurate. Virgel Manifold, MD.    Virgel Manifold, MD 03/12/13 [phone]

## 2013-03-06 ENCOUNTER — Emergency Department (HOSPITAL_COMMUNITY): Payer: Medicaid Other

## 2013-03-06 LAB — CBC WITH DIFFERENTIAL/PLATELET
Basophils Absolute: 0 10*3/uL (ref 0.0–0.1)
Basophils Relative: 0 % (ref 0–1)
Eosinophils Absolute: 0.2 10*3/uL (ref 0.0–0.7)
Eosinophils Relative: 2 % (ref 0–5)
HCT: 43.7 % (ref 36.0–46.0)
Hemoglobin: 14.6 g/dL (ref 12.0–15.0)
Lymphocytes Relative: 26 % (ref 12–46)
Lymphs Abs: 2.1 10*3/uL (ref 0.7–4.0)
MCH: 29.8 pg (ref 26.0–34.0)
MCHC: 33.4 g/dL (ref 30.0–36.0)
MCV: 89.2 fL (ref 78.0–100.0)
Monocytes Absolute: 0.8 10*3/uL (ref 0.1–1.0)
Monocytes Relative: 10 % (ref 3–12)
Neutro Abs: 4.8 10*3/uL (ref 1.7–7.7)
Neutrophils Relative %: 62 % (ref 43–77)
Platelets: 215 10*3/uL (ref 150–400)
RBC: 4.9 MIL/uL (ref 3.87–5.11)
RDW: 13.9 % (ref 11.5–15.5)
WBC: 7.9 10*3/uL (ref 4.0–10.5)

## 2013-03-06 LAB — BASIC METABOLIC PANEL
BUN: 12 mg/dL (ref 6–23)
CO2: 25 mEq/L (ref 19–32)
Calcium: 9.8 mg/dL (ref 8.4–10.5)
Chloride: 97 mEq/L (ref 96–112)
Creatinine, Ser: 0.62 mg/dL (ref 0.50–1.10)
GFR calc Af Amer: >90 mL/min (ref >90)
GFR calc non Af Amer: >90 mL/min (ref >90)
Glucose, Bld: 115 mg/dL — ABNORMAL HIGH (ref 70–99)
Potassium: 4.2 mEq/L (ref 3.7–5.3)
Sodium: 136 mEq/L — ABNORMAL LOW (ref 137–147)

## 2013-03-06 LAB — D-DIMER, QUANTITATIVE: D-Dimer, Quant: 0.65 ug/mL-FEU — ABNORMAL HIGH (ref 0.00–0.48)

## 2013-03-06 MED ORDER — AZITHROMYCIN 250 MG PO TABS
250.0000 mg | ORAL_TABLET | Freq: Every day | ORAL | Status: DC
Start: 2013-03-06 — End: 2013-06-09

## 2013-03-06 MED ORDER — OXYCODONE-ACETAMINOPHEN 5-325 MG PO TABS: 1.0000 | ORAL_TABLET | ORAL | Status: DC | PRN

## 2013-03-06 MED ORDER — ONDANSETRON HCL 4 MG/2ML IJ SOLN
4.0000 mg | Freq: Once | INTRAMUSCULAR | Status: AC
  Administered 2013-03-06: 4 mg via INTRAVENOUS
  Filled 2013-03-06: qty 2

## 2013-03-06 MED ORDER — DEXTROSE 5 % IV SOLN
500.0000 mg | INTRAVENOUS | Status: DC
  Administered 2013-03-06: 500 mg via INTRAVENOUS

## 2013-03-06 MED ORDER — ALBUTEROL SULFATE (2.5 MG/3ML) 0.083% IN NEBU
5.0000 mg | INHALATION_SOLUTION | Freq: Once | RESPIRATORY_TRACT | Status: AC
  Administered 2013-03-06: 5 mg via RESPIRATORY_TRACT
  Filled 2013-03-06: qty 6

## 2013-03-06 MED ORDER — IOHEXOL 350 MG/ML SOLN
100.0000 mL | Freq: Once | INTRAVENOUS | Status: AC | PRN
  Administered 2013-03-06: 100 mL via INTRAVENOUS

## 2013-03-06 MED ORDER — SODIUM CHLORIDE 0.9 % IV BOLUS (SEPSIS)
1000.0000 mL | Freq: Once | INTRAVENOUS | Status: AC
  Administered 2013-03-06: 1000 mL via INTRAVENOUS

## 2013-03-06 MED ORDER — DEXTROSE 5 % IV SOLN
INTRAVENOUS | Status: AC
  Filled 2013-03-06: qty 500

## 2013-03-06 MED ORDER — PREDNISONE 50 MG PO TABS
60.0000 mg | ORAL_TABLET | Freq: Once | ORAL | Status: AC
  Administered 2013-03-06: 01:00:00 60 mg via ORAL
  Filled 2013-03-06 (×2): qty 1

## 2013-03-06 MED ORDER — CEFTRIAXONE SODIUM 1 G IJ SOLR
1.0000 g | Freq: Once | INTRAMUSCULAR | Status: AC
  Administered 2013-03-06: 1 g via INTRAVENOUS
  Filled 2013-03-06: qty 10

## 2013-03-06 MED ORDER — BENZONATATE 100 MG PO CAPS: 100.0000 mg | ORAL_CAPSULE | Freq: Three times a day (TID) | ORAL | Status: DC

## 2013-03-06 MED ORDER — HYDROMORPHONE HCL PF 1 MG/ML IJ SOLN
1.0000 mg | Freq: Once | INTRAMUSCULAR | Status: AC
  Administered 2013-03-06: 1 mg via INTRAVENOUS
  Filled 2013-03-06: qty 1

## 2013-03-06 NOTE — ED Notes (Signed)
Patient transported to CT 

## 2013-03-06 NOTE — Discharge Instructions (Signed)
Pneumonia, Adult °Pneumonia is an infection of the lungs.  °CAUSES °Pneumonia may be caused by bacteria or a virus. Usually, these infections are caused by breathing infectious particles into the lungs (respiratory tract). °SYMPTOMS  °· Cough. °· Fever. °· Chest pain. °· Increased rate of breathing. °· Wheezing. °· Mucus production. °DIAGNOSIS  °If you have the common symptoms of pneumonia, your caregiver will typically confirm the diagnosis with a chest X-ray. The X-ray will show an abnormality in the lung (pulmonary infiltrate) if you have pneumonia. Other tests of your blood, urine, or sputum may be done to find the specific cause of your pneumonia. Your caregiver may also do tests (blood gases or pulse oximetry) to see how well your lungs are working. °TREATMENT  °Some forms of pneumonia may be spread to other people when you cough or sneeze. You may be asked to wear a mask before and during your exam. Pneumonia that is caused by bacteria is treated with antibiotic medicine. Pneumonia that is caused by the influenza virus may be treated with an antiviral medicine. Most other viral infections must run their course. These infections will not respond to antibiotics.  °PREVENTION °A pneumococcal shot (vaccine) is available to prevent a common bacterial cause of pneumonia. This is usually suggested for: °· People over 65 years old. °· Patients on chemotherapy. °· People with chronic lung problems, such as bronchitis or emphysema. °· People with immune system problems. °If you are over 65 or have a high risk condition, you may receive the pneumococcal vaccine if you have not received it before. In some countries, a routine influenza vaccine is also recommended. This vaccine can help prevent some cases of pneumonia. You may be offered the influenza vaccine as part of your care. °If you smoke, it is time to quit. You may receive instructions on how to stop smoking. Your caregiver can provide medicines and counseling to  help you quit. °HOME CARE INSTRUCTIONS  °· Cough suppressants may be used if you are losing too much rest. However, coughing protects you by clearing your lungs. You should avoid using cough suppressants if you can. °· Your caregiver may have prescribed medicine if he or she thinks your pneumonia is caused by a bacteria or influenza. Finish your medicine even if you start to feel better. °· Your caregiver may also prescribe an expectorant. This loosens the mucus to be coughed up. °· Only take over-the-counter or prescription medicines for pain, discomfort, or fever as directed by your caregiver. °· Do not smoke. Smoking is a common cause of bronchitis and can contribute to pneumonia. If you are a smoker and continue to smoke, your cough may last several weeks after your pneumonia has cleared. °· A cold steam vaporizer or humidifier in your room or home may help loosen mucus. °· Coughing is often worse at night. Sleeping in a semi-upright position in a recliner or using a couple pillows under your head will help with this. °· Get rest as you feel it is needed. Your body will usually let you know when you need to rest. °SEEK IMMEDIATE MEDICAL CARE IF:  °· Your illness becomes worse. This is especially true if you are elderly or weakened from any other disease. °· You cannot control your cough with suppressants and are losing sleep. °· You begin coughing up blood. °· You develop pain which is getting worse or is uncontrolled with medicines. °· You have a fever. °· Any of the symptoms which initially brought you in for treatment   are getting worse rather than better. °· You develop shortness of breath or chest pain. °MAKE SURE YOU:  °· Understand these instructions. °· Will watch your condition. °· Will get help right away if you are not doing well or get worse. °Document Released: 02/01/2005 Document Revised: 04/26/2011 Document Reviewed: 04/23/2010 °ExitCare® Patient Information ©2014 [employer]. ° ° °Emergency  Department Resource Guide °1) Find a Doctor and Pay Out of Pocket °Although you won't have to find out who is covered by your insurance plan, it is a good idea to ask around and get recommendations. You will then need to call the office and see if the doctor you have chosen will accept you as a new patient and what types of options they offer for patients who are self-pay. Some doctors offer discounts or will set up payment plans for their patients who do not have insurance, but you will need to ask so you aren't surprised when you get to your appointment. ° °2) Contact Your Local Health Department °Not all health departments have doctors that can see patients for sick visits, but many do, so it is worth a call to see if yours does. If you don't know where your local health department is, you can check in your phone book. The CDC also has a tool to help you locate your state's health department, and many state websites also have listings of all of their local health departments. ° °3) Find a Walk-in Clinic °If your illness is not likely to be very severe or complicated, you may want to try a walk in clinic. These are popping up all over the country in pharmacies, drugstores, and shopping centers. They're usually staffed by nurse practitioners or physician assistants that have been trained to treat common illnesses and complaints. They're usually fairly quick and inexpensive. However, if you have serious medical issues or chronic medical problems, these are probably not your best option. ° °No Primary Care Doctor: °- Call Health Connect at  [phone] - they can help you locate a primary care doctor that  accepts your insurance, provides certain services, etc. °- Physician Referral Service- 1-800-533-3463 ° °Chronic Pain Problems: °Organization         Address  Phone   Notes  °[facility]  ([phone] Patients need to be referred by their primary care doctor.  ° °Medication  Assistance: °Organization         Address  Phone   Notes  °[place] [facility] [address] °[place], [place] [address] [phone]) [phone] --Must be a resident of [place] °-- Must have NO insurance coverage whatsoever (no Medicaid/ Medicare, etc.) °-- The pt. MUST have a primary care doctor that directs their care regularly and follows them in the community °  °[facility]  (866) 331-1348   °[employer]  (888) 892-1162   ° °Agencies that provide inexpensive medical care: °Organization         Address  Phone   Notes  °[facility]  ([phone]) [phone]   °[facility]    ([phone]) 832-7272   °[facility] [address] °[place], [place] [address] [phone]) [phone]   °[facility] [address], °[place] ([phone]) [phone]   °Planned Parenthood    ([phone]) 373-0678   °[facility]    (336) 272-1050   °[facility] ° [address], [place] Phone:  ([phone]) [phone],   Fax:  [phone] Hours of Operation:  9 am - 6 pm, M-F.  Also accepts Medicaid/Medicare and self-pay.  [facility] for [place], [address], [place] Phone: [phone], Fax: [phone]. Hours of Operation:  8:30 am - 5:30 pm, M-F.  Also accepts Medicaid and self-pay.  [facility] [place] [address] [place] Phone: [phone]   [place], [place] Perez, [place] [phone], Ext. [phone] Mondays & Thursdays: 7-9 AM.  First 15 patients are seen on a first come, first serve basis.    [place] Providers:  Organization         Address  Phone   Notes  [facility] [address], [place] [phone] Also accepts self-pay patients.  [facility] [address], [place]  [phone]   [place], [address], [place]  [phone]   [facility] [address], [place] [phone]   Lucianne Lei [address], [place]   [phone] Only accepts [place] Access [place] patients after they have their name applied to their card.   Self-Pay (no insurance) in [facility]:  Organization         Address  Phone   Notes  Sickle Cell Patients, [facility] [place] [phone]   [facility] [place] [phone]   [facility] [place]  [place], [place], [place] [phone]   [facility]/Dr. Osei-Bonsu  [address] [place] or [address], [place] [phone] Phone number for both [place] and [place] locations is the same.  Urgent Medical and [facility] [address], [place] [phone]   [facility] [address], [place] or [address] [phone]   [facility] [address], [place] [phone], phone; [phone], fax Sees patients 1st and 3rd Saturday of every month.  Must not qualify for public or private insurance (i.e. Medicaid, Medicare, [place] Health Choice, Veterans' Benefits)  Household income should be no more than 200% of the poverty level The clinic cannot treat you if you are pregnant or think you are pregnant  Sexually transmitted diseases are not treated at the clinic.    Dental Care: Organization         Address  Phone  Notes  [facility] Department of [facility] [place] [phone] Accepts children up to age 39 who are enrolled in [place] or [place]; pregnant women with a Medicaid card; and children who have applied for Medicaid or [place] Health Choice, but were declined, whose parents can pay a reduced fee at time of service.  [facility]  Department of [facility]  [address], [place] [phone] Accepts children up to age 11 who are enrolled in [place] or [place]; pregnant women with a Medicaid card; and children who have applied for Medicaid or [place] Health Choice, but were declined, whose parents can pay a reduced fee at time of service.  [place] [facility] PRO[facility]  [place] [phone] Patients are seen by appointment only. Walk-ins are not  accepted. [place] will see patients 55 years of age and older. Monday - Tuesday (8am-5pm) Most Wednesdays (8:30-5pm) $30 per visit, cash only  [place]  [address], [facility] [phone] Patients are seen by appointment only. Walk-ins are not accepted. [place] will see patients 43 years of age and older. One Wednesday Evening (Monthly: Volunteer Based).  $30 per visit, cash only  [place]  [phone] for adults; Children under age 13, call Graduate Pediatric Dentistry at [phone]. Children aged 7-14, please call [phone] to request a pediatric application.  Dental services are provided in all areas of dental care including fillings, crowns and bridges, complete and partial dentures, implants, gum treatment, root canals, and extractions. Preventive care is also provided. Treatment is provided to both adults and children. Patients are selected via a lottery and there is often a waiting list.   [facility] [address], [place]  [phone] [email or web address]   [facility] [address] [place], [place] [phone], Ext. [phone] Second and Fourth Thursday of each month, opens at 6:30 AM; Clinic ends at 9 AM.  Patients are seen on a first-come first-served basis, and a limited number are seen during each clinic.   [facility]  [address] [place] Danker [place], [place] [phone]    Eligibility Requirements You must have lived in [place], [place], or [place] counties for at least the last three months.   You cannot be eligible for state or federal sponsored [employer], including [employer], [place], or [employer].   You generally cannot be eligible for healthcare insurance through your employer.    How to apply: Eligibility screenings are held every Tuesday and Wednesday afternoon from 1:00 pm until 4:00 pm. You do not need an appointment for the interview!  [facility] [address] [place], [place]   [place]  [place] Department  [place]  [phone]    Behavioral Health Resources in the Community: Intensive Outpatient Programs Organization         Address  Phone  Notes  [place] [facility] [place]. [address] [place], [place] [phone]   [facility] [address] [place], [place]   [facility] [address], [place], [place]   [place] [address]  [place], [place] or [phone]   Substance Abuse Resources Organization         Address  Phone  Notes  Alcohol and Drug Services  ([phone]   [place]  [phone]   [facility] [place]   Chinita Pester  [phone]   Residential & Outpatient Substance Abuse Program  [phone]   Psychological Services Organization         Address  Phone  Notes  [facility] [place]  [place]  [phone]   [place] [address], [place] or [phone]    Mobile Crisis Teams Organization         Address  Phone  Notes  [facility], Mobile Crisis Care Unit  [phone]   [facility]  [address]  [place], [place]   [facility] [address], [place] [phone]    Self-Help/Support Groups  Organization         Address  Phone             Notes  [facility] [place] - variety of support groups  [place] Call for more information  Narcotics Anonymous (NA), [facility] [address], [employer] [place]  2 meetings at this location   Special educational needs teacher         Address  Phone  Notes  [facility] [place],    [place]  [phone]   [facility]  [address] [place] [phone], [place], [place]   [place], [place] [phone] Admissions: 8am-3pm M-F  Incentives Substance [place] [phone],    [place], [place] [phone]   [facility] [address] [place], [place], [place]   [facility] [place] [address]  [place], [place]   [facility] - Intensive Outpatient [address] Kristeen [address], [place], [place]   [facility] ([place].,  [place], [place] [phone] or [phone]   [facility] ([facility]) [address], [place], [place] Accepts Medicaid  [facility] [address]  [place] [phone] Substance Abuse/Addiction Treatment   [facility] Organization         Address  Phone  Notes  [facility]  [phone]   Domenic Schwab, PhD [address] Arlis Porta [place], [place]   [phone] or [phone]   [place], [place] [phone]   [facility] [address], [place], [place] [phone] Insurance/Medicaid/sponsorship through [facility] Families [address] [place]                                    [place], [place] [phone] [place] [address], [place] [phone]    Dr. Adele Schilder  [phone]   [facility] Dept. 1) [address] [place] 2) [place] 3)  [place] [address], [place] [phone]  [phone]   [place] [phone] or [phone] (After Hours)     '

## 2013-06-09 ENCOUNTER — Encounter (HOSPITAL_COMMUNITY): Payer: Self-pay | Admitting: Emergency Medicine

## 2013-06-09 ENCOUNTER — Emergency Department (HOSPITAL_COMMUNITY)
Admission: EM | Admit: 2013-06-09 | Discharge: 2013-06-09 | Disposition: A | Discharge status: Home / Self Care | Payer: Medicaid Other | Attending: Emergency Medicine | Admitting: Emergency Medicine

## 2013-06-09 ENCOUNTER — Emergency Department (HOSPITAL_COMMUNITY): Payer: Medicaid Other

## 2013-06-09 DIAGNOSIS — Z792 Long term (current) use of antibiotics: Secondary | ICD-10-CM | POA: Insufficient documentation

## 2013-06-09 DIAGNOSIS — F329 Major depressive disorder, single episode, unspecified: Secondary | ICD-10-CM | POA: Insufficient documentation

## 2013-06-09 DIAGNOSIS — R079 Chest pain, unspecified: Secondary | ICD-10-CM | POA: Insufficient documentation

## 2013-06-09 DIAGNOSIS — Z791 Long term (current) use of non-steroidal anti-inflammatories (NSAID): Secondary | ICD-10-CM | POA: Insufficient documentation

## 2013-06-09 DIAGNOSIS — M549 Dorsalgia, unspecified: Secondary | ICD-10-CM | POA: Insufficient documentation

## 2013-06-09 DIAGNOSIS — R109 Unspecified abdominal pain: Secondary | ICD-10-CM | POA: Insufficient documentation

## 2013-06-09 DIAGNOSIS — IMO0002 Reserved for concepts with insufficient information to code with codable children: Secondary | ICD-10-CM | POA: Insufficient documentation

## 2013-06-09 DIAGNOSIS — Z79899 Other long term (current) drug therapy: Secondary | ICD-10-CM | POA: Insufficient documentation

## 2013-06-09 DIAGNOSIS — M62838 Other muscle spasm: Secondary | ICD-10-CM | POA: Insufficient documentation

## 2013-06-09 DIAGNOSIS — IMO0001 Reserved for inherently not codable concepts without codable children: Secondary | ICD-10-CM | POA: Insufficient documentation

## 2013-06-09 DIAGNOSIS — J441 Chronic obstructive pulmonary disease with (acute) exacerbation: Secondary | ICD-10-CM | POA: Insufficient documentation

## 2013-06-09 DIAGNOSIS — J690 Pneumonitis due to inhalation of food and vomit: Secondary | ICD-10-CM

## 2013-06-09 DIAGNOSIS — J45901 Unspecified asthma with (acute) exacerbation: Secondary | ICD-10-CM

## 2013-06-09 DIAGNOSIS — F17200 Nicotine dependence, unspecified, uncomplicated: Secondary | ICD-10-CM | POA: Insufficient documentation

## 2013-06-09 DIAGNOSIS — F3289 Other specified depressive episodes: Secondary | ICD-10-CM | POA: Insufficient documentation

## 2013-06-09 LAB — COMPREHENSIVE METABOLIC PANEL
ALBUMIN: 3.5 g/dL (ref 3.5–5.2)
ALK PHOS: 83 U/L (ref 39–117)
ALT: 9 U/L (ref 0–35)
AST: 18 U/L (ref 0–37)
BUN: 14 mg/dL (ref 6–23)
CO2: 24 mEq/L (ref 19–32)
Calcium: 9.7 mg/dL (ref 8.4–10.5)
Chloride: 97 mEq/L (ref 96–112)
Creatinine, Ser: 0.63 mg/dL (ref 0.50–1.10)
GFR calc Af Amer: >90 mL/min (ref >90)
GFR calc non Af Amer: >90 mL/min (ref >90)
Glucose, Bld: 114 mg/dL — ABNORMAL HIGH (ref 70–99)
POTASSIUM: 4.3 meq/L (ref 3.7–5.3)
SODIUM: 134 meq/L — AB (ref 137–147)
TOTAL PROTEIN: 8 g/dL (ref 6.0–8.3)
Total Bilirubin: 0.2 mg/dL — ABNORMAL LOW (ref 0.3–1.2)

## 2013-06-09 LAB — CBC WITH DIFFERENTIAL/PLATELET
BASOS ABS: 0 10*3/uL (ref 0.0–0.1)
BASOS PCT: 0 % (ref 0–1)
EOS ABS: 0.2 10*3/uL (ref 0.0–0.7)
Eosinophils Relative: 3 % (ref 0–5)
HCT: 40.6 % (ref 36.0–46.0)
Hemoglobin: 13.6 g/dL (ref 12.0–15.0)
Lymphocytes Relative: 29 % (ref 12–46)
Lymphs Abs: 2.3 10*3/uL (ref 0.7–4.0)
MCH: 29.7 pg (ref 26.0–34.0)
MCHC: 33.5 g/dL (ref 30.0–36.0)
MCV: 88.6 fL (ref 78.0–100.0)
Monocytes Absolute: 0.5 10*3/uL (ref 0.1–1.0)
Monocytes Relative: 6 % (ref 3–12)
NEUTROS PCT: 62 % (ref 43–77)
Neutro Abs: 4.9 10*3/uL (ref 1.7–7.7)
PLATELETS: 293 10*3/uL (ref 150–400)
RBC: 4.58 MIL/uL (ref 3.87–5.11)
RDW: 14 % (ref 11.5–15.5)
WBC: 7.9 10*3/uL (ref 4.0–10.5)

## 2013-06-09 LAB — TROPONIN I

## 2013-06-09 MED ORDER — LEVOFLOXACIN 500 MG PO TABS: 500.0000 mg | ORAL_TABLET | Freq: Every day | ORAL | Status: DC

## 2013-06-09 MED ORDER — LORAZEPAM 1 MG PO TABS: ORAL_TABLET | ORAL | Status: DC

## 2013-06-09 MED ORDER — IPRATROPIUM-ALBUTEROL 0.5-2.5 (3) MG/3ML IN SOLN
3.0000 mL | Freq: Once | RESPIRATORY_TRACT | Status: AC
  Administered 2013-06-09: 3 mL via RESPIRATORY_TRACT
  Filled 2013-06-09: qty 3

## 2013-06-09 MED ORDER — HYDROMORPHONE HCL PF 1 MG/ML IJ SOLN
1.0000 mg | Freq: Once | INTRAMUSCULAR | Status: AC
  Administered 2013-06-09: 1 mg via INTRAVENOUS
  Filled 2013-06-09: qty 1

## 2013-06-09 MED ORDER — SODIUM CHLORIDE 0.9 % IV BOLUS (SEPSIS)
1000.0000 mL | Freq: Once | INTRAVENOUS | Status: AC
  Administered 2013-06-09: 1000 mL via INTRAVENOUS

## 2013-06-09 MED ORDER — LORAZEPAM 2 MG/ML IJ SOLN
1.0000 mg | Freq: Once | INTRAMUSCULAR | Status: AC
  Administered 2013-06-09: 0.5 mg via INTRAVENOUS
  Filled 2013-06-09: qty 1

## 2013-06-09 NOTE — Discharge Instructions (Signed)
Follow-up in 1-2 weeks.

## 2013-06-09 NOTE — ED Provider Notes (Signed)
CSN: [id number]     Arrival date & time 06/09/13  1946 History  This chart was scribed for Maudry Diego, MD by Randa Evens, ED Scribe. This patient was seen in room APA04/APA04 and the patient's care was started at 8:35 PM.  Chief Complaint  Patient presents with  . Spasms    Patient is a 43 y.o. female presenting with general illness. The history is provided by the patient. No language interpreter was used.  Illness Location:  Generalized, worst in chest, back, and left calf Quality:  Cramping, spasms Severity:  Moderate Onset quality:  Gradual Duration:  12 months Timing:  Intermittent Progression:  Waxing and waning Chronicity:  Chronic Relieved by:  None Worsened by:  None Ineffective treatments:  None Associated symptoms: abdominal pain, chest pain and myalgias (generalized)   Associated symptoms: no congestion, no cough, no diarrhea, no fatigue, no headaches and no rash    HPI Comments: Dawn Carter is a 43 y.o. female who presents to the Emergency Department complaining of generalized cramps and spasm for 1 year. Pt states the pain has worsened in chest, back, and left calf. Denies h/o of similar symptoms except in hands. Pt denies any symptoms of emesis.  Past Medical History  Diagnosis Date  . Asthma   . Depression    History reviewed. No pertinent past surgical history. History reviewed. No pertinent family history. History  Substance Use Topics  . Smoking status: Current Every Day Smoker  . Smokeless tobacco: Not on file  . Alcohol Use: Yes     Comment: beer   OB History   Grav Para Term Preterm Abortions TAB SAB Ect Mult Living                 Review of Systems  Constitutional: Negative for appetite change and fatigue.  HENT: Negative for congestion, ear discharge and sinus pressure.   Eyes: Negative for discharge.  Respiratory: Negative for cough.   Cardiovascular: Positive for chest pain.  Gastrointestinal: Positive for abdominal pain.  Negative for diarrhea.  Genitourinary: Negative for frequency and hematuria.  Musculoskeletal: Positive for back pain and myalgias (generalized).  Skin: Negative for rash.  Neurological: Negative for seizures and headaches.  Psychiatric/Behavioral: Negative for hallucinations.      Allergies  Coconut fatty acids and Chantix  Home Medications   Prior to Admission medications   Medication Sig Start Date End Date Taking? Authorizing Provider  albuterol (PROAIR HFA) 108 (90 BASE) MCG/ACT inhaler Inhale 2 puffs into the lungs every 6 (six) hours as needed for wheezing or shortness of breath.     Historical Provider, MD  ALPRAZolam Duanne Moron) 1 MG tablet Take 1 mg by mouth 3 (three) times daily as needed for anxiety.    Historical Provider, MD  azithromycin (ZITHROMAX) 250 MG tablet Take 1 tablet (250 mg total) by mouth daily. Take first 2 tablets together, then 1 every day until finished. 03/06/13   Virgel Manifold, MD  beclomethasone (QVAR) 40 MCG/ACT inhaler Inhale 1 puff into the lungs 2 (two) times daily.     Historical Provider, MD  benzonatate (TESSALON) 100 MG capsule Take 1 capsule (100 mg total) by mouth every 8 (eight) hours. 03/06/13   Virgel Manifold, MD  doxycycline (VIBRAMYCIN) 100 MG capsule Take 1 capsule (100 mg total) by mouth 2 (two) times daily. 02/13/13   Jasper Riling. Pickering, MD  ibuprofen (ADVIL,MOTRIN) 200 MG tablet Take 200 mg by mouth daily as needed for headache, mild pain, moderate pain  or cramping.    Historical Provider, MD  naproxen (NAPROSYN) 500 MG tablet Take 1 tablet (500 mg total) by mouth 2 (two) times daily with a meal. 11/16/12   Johnna Acosta, MD  naproxen (NAPROSYN) 500 MG tablet Take 500-1,000 mg by mouth daily as needed for mild pain or moderate pain (for pain).    Historical Provider, MD  oxyCODONE-acetaminophen (PERCOCET/ROXICET) 5-325 MG per tablet Take 1-2 tablets by mouth every 6 (six) hours as needed for severe pain. 02/13/13   Jasper Riling. Pickering, MD   oxyCODONE-acetaminophen (PERCOCET/ROXICET) 5-325 MG per tablet Take 1 tablet by mouth every 4 (four) hours as needed for severe pain. 03/06/13   Virgel Manifold, MD  Phenyleph-CPM-DM-Aspirin (ALKA-SELTZER PLUS COLD & COUGH) 7.09-16-08-325 MG TBEF Take 1 tablet by mouth daily as needed (for cold symptoms).    Historical Provider, MD   Triage Vitals: BP 129/69  Pulse 90  Temp(Src) 98.3 F (36.8 C)  Resp 20  Ht 5\' 8"  (1.727 m)  Wt 224 lb (101.606 kg)  BMI 34.07 kg/m2  SpO2 93%  LMP 06/05/2013  Physical Exam  Nursing note and vitals reviewed. Constitutional: She is oriented to person, place, and time. She appears well-developed.  HENT:  Head: Normocephalic.  Eyes: Conjunctivae and EOM are normal. No scleral icterus.  Neck: Neck supple. No thyromegaly present.  Cardiovascular: Normal rate and regular rhythm.  Exam reveals no gallop and no friction rub.   No murmur heard. Pulmonary/Chest: No stridor. She has wheezes. She has no rales. She exhibits tenderness.  Moderate wheezing bilaterally  Abdominal: She exhibits no distension. There is no tenderness. There is no rebound.  Musculoskeletal: Normal range of motion. She exhibits tenderness. She exhibits no edema.  Tenderness to left calf, and T-spine  Lymphadenopathy:    She has no cervical adenopathy.  Neurological: She is oriented to person, place, and time. She exhibits normal muscle tone. Coordination normal.  Skin: No rash noted. No erythema.  Psychiatric: She has a normal mood and affect. Her behavior is normal.    ED Course  Procedures (including critical care time) DIAGNOSTIC STUDIES: Oxygen Saturation is 93% on RA, adequate by my interpretation.    COORDINATION OF CARE: 8:40 PM-Discussed treatment plan which includes medications, CXR, CBC panel, CMP and EKG with pt at bedside and pt agreed to plan.     Labs Review Labs Reviewed  CBC WITH DIFFERENTIAL  COMPREHENSIVE METABOLIC PANEL  TROPONIN I    Imaging Review No  results found.   EKG Interpretation   Date/Time:  Saturday June 09 2013 20:53:57 EDT Ventricular Rate:  108 PR Interval:  124 QRS Duration: 88 QT Interval:  316 QTC Calculation: 423 R Axis:   72 Text Interpretation:  Sinus tachycardia Right atrial enlargement Possible  Right ventricular hypertrophy Abnormal ECG When compared with ECG of  05-Mar-2013 23:54, No significant change was found Confirmed by Richrd Kuzniar   MD, Jamarquis Crull [phone]) on 06/09/2013 9:58:25 PM      MDM   Final diagnoses:  None   The chart was scribed for me under my direct supervision.  I personally performed the history, physical, and medical decision making and all procedures in the evaluation of this patient.Maudry Diego, MD 06/09/13 2200

## 2013-06-09 NOTE — ED Notes (Signed)
Pt c/o spasms in her hands x 1year. Pt also c/o muscle spasms and crams all over body.

## 2013-06-21 ENCOUNTER — Encounter (HOSPITAL_COMMUNITY): Payer: Self-pay | Admitting: Emergency Medicine

## 2013-06-21 ENCOUNTER — Emergency Department (HOSPITAL_COMMUNITY)
Admission: EM | Admit: 2013-06-21 | Discharge: 2013-06-22 | Disposition: A | Discharge status: Home / Self Care | Payer: Medicaid Other | Attending: Emergency Medicine | Admitting: Emergency Medicine

## 2013-06-21 DIAGNOSIS — IMO0002 Reserved for concepts with insufficient information to code with codable children: Secondary | ICD-10-CM | POA: Insufficient documentation

## 2013-06-21 DIAGNOSIS — F3289 Other specified depressive episodes: Secondary | ICD-10-CM | POA: Insufficient documentation

## 2013-06-21 DIAGNOSIS — F329 Major depressive disorder, single episode, unspecified: Secondary | ICD-10-CM | POA: Insufficient documentation

## 2013-06-21 DIAGNOSIS — R443 Hallucinations, unspecified: Secondary | ICD-10-CM

## 2013-06-21 DIAGNOSIS — F1010 Alcohol abuse, uncomplicated: Secondary | ICD-10-CM | POA: Insufficient documentation

## 2013-06-21 DIAGNOSIS — F17200 Nicotine dependence, unspecified, uncomplicated: Secondary | ICD-10-CM | POA: Insufficient documentation

## 2013-06-21 DIAGNOSIS — Z79899 Other long term (current) drug therapy: Secondary | ICD-10-CM | POA: Insufficient documentation

## 2013-06-21 DIAGNOSIS — J45909 Unspecified asthma, uncomplicated: Secondary | ICD-10-CM | POA: Insufficient documentation

## 2013-06-21 LAB — URINALYSIS, ROUTINE W REFLEX MICROSCOPIC
Bilirubin Urine: NEGATIVE
Glucose, UA: NEGATIVE mg/dL
Hgb urine dipstick: NEGATIVE
Ketones, ur: NEGATIVE mg/dL
Leukocytes, UA: NEGATIVE
NITRITE: NEGATIVE
PH: 6 (ref 5.0–8.0)
Protein, ur: NEGATIVE mg/dL
SPECIFIC GRAVITY, URINE: 1.01 (ref 1.005–1.030)
Urobilinogen, UA: 0.2 mg/dL (ref 0.0–1.0)

## 2013-06-21 LAB — CBC
HCT: 40.6 % (ref 36.0–46.0)
Hemoglobin: 13.3 g/dL (ref 12.0–15.0)
MCH: 29.5 pg (ref 26.0–34.0)
MCHC: 32.8 g/dL (ref 30.0–36.0)
MCV: 90 fL (ref 78.0–100.0)
Platelets: 237 10*3/uL (ref 150–400)
RBC: 4.51 MIL/uL (ref 3.87–5.11)
RDW: 14 % (ref 11.5–15.5)
WBC: 8.3 10*3/uL (ref 4.0–10.5)

## 2013-06-21 LAB — BASIC METABOLIC PANEL
BUN: 11 mg/dL (ref 6–23)
CO2: 22 mEq/L (ref 19–32)
Calcium: 8.9 mg/dL (ref 8.4–10.5)
Chloride: 101 mEq/L (ref 96–112)
Creatinine, Ser: 0.78 mg/dL (ref 0.50–1.10)
GFR calc Af Amer: >90 mL/min (ref >90)
GFR calc non Af Amer: >90 mL/min (ref >90)
Glucose, Bld: 104 mg/dL — ABNORMAL HIGH (ref 70–99)
Potassium: 4 mEq/L (ref 3.7–5.3)
Sodium: 136 mEq/L — ABNORMAL LOW (ref 137–147)

## 2013-06-21 LAB — ETHANOL: Alcohol, Ethyl (B): 75 mg/dL — ABNORMAL HIGH (ref 0–11)

## 2013-06-21 LAB — RAPID URINE DRUG SCREEN, HOSP PERFORMED
Amphetamines: NOT DETECTED
BENZODIAZEPINES: NOT DETECTED
Barbiturates: NOT DETECTED
Cocaine: NOT DETECTED
Opiates: NOT DETECTED
Tetrahydrocannabinol: NOT DETECTED

## 2013-06-21 MED ORDER — ALBUTEROL SULFATE (2.5 MG/3ML) 0.083% IN NEBU
5.0000 mg | INHALATION_SOLUTION | Freq: Once | RESPIRATORY_TRACT | Status: AC
  Administered 2013-06-21: 5 mg via RESPIRATORY_TRACT
  Filled 2013-06-21: qty 6

## 2013-06-21 NOTE — ED Notes (Signed)
Daughter arrived and states, " my little brother said she was talking about hearing voices and had to get voices out of her head. She ran her head through a glass window" daughter also states she feels like " she has been taking some drugs and she drink a lot"

## 2013-06-21 NOTE — ED Notes (Signed)
EMS reports cbg 43

## 2013-06-21 NOTE — ED Notes (Signed)
Per ems, pt family reports pt "was not herself" today.  Family reports that the pt went yesterday to urgent care to get a refill on her xanax but they refused to give her any.  Family reports pt went down the road about an hour ago and "took something", but they are unsure what she took.  Family reports pt talking to a deodorant bottle, and accusing it of sleeping with her boyfriend.  EMS reports the pt busting her head through a window while they were there but reports no obvious injuries.  EMS reports the pt being combative and uncooperative.

## 2013-06-21 NOTE — ED Notes (Signed)
Pt attempted to give urine sample at this time.  

## 2013-06-21 NOTE — ED Provider Notes (Signed)
CSN: [id number]     Arrival date & time 06/21/13  1638 History   First MD Initiated Contact with Patient 06/21/13 1646     Chief Complaint  Patient presents with  . V70.1     (Consider location/radiation/quality/duration/timing/severity/associated sxs/prior Treatment) HPI Comments: 43 y/o female with hx of "hearing voices" since she was 43 years old who also drinks daily - heavily - and today has already had 3 40's though she states that she usually stops driking at 6 PM.  She states that she has been having very bad back pains over the last few weeks - she has been prescribed valium for her anxiety and panic attacks and tried using this for back spasm and states it did not help - she states that her back pain became worse (chronic) and that she sent children to the neighbors to call 911 - when they arrived, PD found her to be combative, intoxicated but not a danger to herself or others - when she calmed down, they left and the pt became combative again - she denies having any suicidal thoughts but does endorse that the voices in her head have been increasingly more common today and to stop them she tried to hit her head on the glass window - she reportedly put her head all th way through the window.  She calmed down enroute.  She states "I'm not staying tonight" and refutes the story from EMS that she was walking down the street altered (described as agitated, combative and intoxicated by EMS).    The history is provided by the patient.    Past Medical History  Diagnosis Date  . Asthma   . Depression    History reviewed. No pertinent past surgical history. No family history on file. History  Substance Use Topics  . Smoking status: Current Every Day Smoker  . Smokeless tobacco: Not on file  . Alcohol Use: Yes     Comment: beer   OB History   Grav Para Term Preterm Abortions TAB SAB Ect Mult Living                 Review of Systems  All other systems reviewed and are  negative.     Allergies  Coconut fatty acids and Chantix  Home Medications   Prior to Admission medications   Medication Sig Start Date End Date Taking? Authorizing Provider  albuterol (PROAIR HFA) 108 (90 BASE) MCG/ACT inhaler Inhale 2 puffs into the lungs every 6 (six) hours as needed for wheezing or shortness of breath.     Historical Provider, MD  diazepam (VALIUM) 10 MG tablet Take 5 mg by mouth every 6 (six) hours as needed for anxiety.    Historical Provider, MD  fluticasone (FLONASE) 50 MCG/ACT nasal spray Place 1 spray into both nostrils daily.    Historical Provider, MD  Fluticasone-Salmeterol (ADVAIR) 250-50 MCG/DOSE AEPB Inhale 1 puff into the lungs 2 (two) times daily.    Historical Provider, MD  levofloxacin (LEVAQUIN) 500 MG tablet Take 1 tablet (500 mg total) by mouth daily. 06/09/13   Maudry Diego, MD  LORazepam (ATIVAN) 1 MG tablet Take one every 8 hours for muscle spasm 06/09/13   Maudry Diego, MD   BP 112/60  Pulse 97  Temp(Src) 98 F (36.7 C) (Oral)  Resp 18  Ht 5\' 8"  (1.727 m)  Wt 224 lb (101.606 kg)  BMI 34.07 kg/m2  SpO2 92%  LMP 06/05/2013 Physical Exam  Nursing note and vitals  reviewed. Constitutional: She appears well-developed and well-nourished. No distress.  HENT:  Head: Normocephalic and atraumatic.  Mouth/Throat: Oropharynx is clear and moist. No oropharyngeal exudate.  No signs of hematoma / abrasion / laceration or swelling or any kind.  No malocclusion, no hemotympanum and no racoon eyes or battle's sign.  Eyes: Conjunctivae and EOM are normal. Pupils are equal, round, and reactive to light. Right eye exhibits no discharge. Left eye exhibits no discharge. No scleral icterus.  Neck: Normal range of motion. Neck supple. No JVD present. No thyromegaly present.  Cardiovascular: Normal rate, regular rhythm, normal heart sounds and intact distal pulses.  Exam reveals no gallop and no friction rub.   No murmur heard. Pulmonary/Chest: Effort  normal and breath sounds normal. No respiratory distress. She has no wheezes. She has no rales.  Abdominal: Soft. Bowel sounds are normal. She exhibits no distension and no mass. There is no tenderness.  Musculoskeletal: Normal range of motion. She exhibits no edema and no tenderness.  Lymphadenopathy:    She has no cervical adenopathy.  Neurological: She is alert. Coordination normal.  The pt moves all extremities X 4 and speaks clearly - she has normal coordination and CN 3-12 are normal  Skin: Skin is warm and dry. No rash noted. No erythema.  Psychiatric: She has a normal mood and affect. Her behavior is normal.  She is not responding to internal stimuli at this time - she relates a clear history though it differs somewhat from hx of EMS.    ED Course  Procedures (including critical care time) Labs Review Labs Reviewed  ETHANOL - Abnormal; Notable for the following:    Alcohol, Ethyl (B) 75 (*)    All other components within normal limits  BASIC METABOLIC PANEL - Abnormal; Notable for the following:    Sodium 136 (*)    Glucose, Bld 104 (*)    All other components within normal limits  CBC  URINALYSIS, ROUTINE W REFLEX MICROSCOPIC  URINE RAPID DRUG SCREEN (HOSP PERFORMED)    Imaging Review No results found.    MDM   Final diagnoses:  Hallucinations  Alcohol abuse    No signs of injury - but has increasing auditory hallucinations.  She will need psych consult.  Neuro checks and cardiac monitoring until she is more stable psychiatrically (with ETOH on board).  The patient has wandered out of the emergency department without telling staff. Because of her increased mental status change and what her daughter states after arrival stating that she has had increased violent behavior and increased hallucinations, takes care of little children during the day she is concerned for her safety and the safety of the children. I have asked the police officers to try to find the patient  bring her back and I have signed involuntary commitment papers to further facilitate psychiatric evaluation in a safe environment.  The pt was found by police, she has been returned and is currently under IVC - she is currently willing to stay but has a sitter by bed.  Labs unremarkable except for expected alcohol elevation.  Awaiting TTS consult  Change of shift - care signed out to oncoming physicians  Johnna Acosta, MD 06/21/13 2150

## 2013-06-21 NOTE — ED Notes (Signed)
Spoke to pt advocate about getting pt a meal tray.

## 2013-06-21 NOTE — ED Notes (Signed)
Went to take pt to bathroom and pt was not in room. Found pt in waiting room walking outside. Asked to come back to room and pt refused kept walking pt continued to walk across parking lot towards [place]. Charge Nurse, Magda Paganini and my self continued to ask pt to come back. Pt refused. [name] notified and asked for [place] to be notified to pick pt up and bring back to ER for treatment. Pt was brought in by EMS for  Hearing voices in her head and head butting a glass window to get voices stopped. Pt also reports drink 4-6 40oz beers a day. Pt was present at her home when this began and had in her car children of age 92,6 and 2. [name] aware of kids at home.

## 2013-06-22 NOTE — ED Provider Notes (Signed)
Patient initially seen and evaluated by [name]. She had been drinking and had been having hallucinations. She currently and states that she is not having hallucinations and she thinks she was seeing things because of degree of pain she was in. She denies depression and suicidal ideation or homicidal ideation. TTS consultation is appreciated in the [facility] she can contract for safety. She has followup appointment with her psychiatrist in about 3 weeks. She states that she also has an appointment later this morning with someone who is going to help her get medications and treatments. She denies any hallucinations currently. She appears to be stable for discharge.   Delora Fuel, MD 82/50/03 [id number]

## 2013-06-22 NOTE — BH Assessment (Signed)
Tele Assessment Note   Dawn Carter is an 43 y.o. female.  -Clinician talked to [name] at [place] regarding need for TTS consult.  He said that patient has been drinking 40's during the day and has been taking valium and pain medication also to address chronic back pain.  Patient had endorsed hearing voices with him.  She had hit her head on a glass window (breaking it) to get the voices to stop.  Patient was initially combative and walked out of the ED, refusing to come back.  [place] PD had talked her into coming back.  At that time [name] placed patient in IVC.  Patient was alert and coherent when clinician talked to her.  She did complain of back pain.  She reports only drinking two 40's today and that this was the first time in two weeks that she had drank.  Patient said that her pain gets to be so bad that she does not know what to do.  When asked about the voices she denies hearing voices.  She says that the glass in the window broke because when she bent down to reach her purse near the window her head hit it and broke it.  She denies trying to quiet voices by hitting her head on glass.  Patient also denies any SI or HI at this time.  She denies any current depressive symptoms although she admits to having depression.  Pt does not appear to be responding to internal stimuli at this time.  She also does not want detox services now.  Patient says that she sees Dr. Mirna Carter at [facility] in [place] and her next appt is 06/02.  Pt reports that she has had inpatient care before but cannot recall the timeframe.  Patient says that she has an doctor appointment today to have her back looked at again.    -clinician spoke with [name] about patient denying SI, HI and any current A/V hallucinations.  [name] said that he will examine patient.  Clinician talked to nurse later and found that Dr. [name] had rescinded the IVC and patient had been discharged.    Axis I: Bipolar, Manic Axis II:  Deferred Axis III:  Past Medical History  Diagnosis Date  . Asthma   . Depression    Axis IV: economic problems, occupational problems, other psychosocial or environmental problems and problems related to social environment Axis V: 41-50 serious symptoms  Past Medical History:  Past Medical History  Diagnosis Date  . Asthma   . Depression     History reviewed. No pertinent past surgical history.  Family History: No family history on file.  Social History:  reports that she has been smoking.  She does not have any smokeless tobacco history on file. She reports that she drinks alcohol. She reports that she does not use illicit drugs.  Additional Social History:  Alcohol / Drug Use Pain Medications: See PTA medication list Prescriptions: See PTA medication list Over the Counter: See PTA medication lsit History of alcohol / drug use?: Yes Substance #1 Name of Substance 1: ETOH 1 - Age of First Use: 43 years of age 65 - Amount (size/oz): One 40 per day 1 - Frequency: Daily use 1 - Duration: On-going 1 - Last Use / Amount: 05/07  CIWA: CIWA-Ar BP: 128/48 mmHg Pulse Rate: 76 COWS:    Allergies:  Allergies  Allergen Reactions  . Coconut Fatty Acids Anaphylaxis  . Chantix [Varenicline] Itching and Rash  Home Medications:  (Not in a hospital admission)  OB/GYN Status:  Patient's last menstrual period was 06/05/2013.  General Assessment Data Location of Assessment: [facility] ED Is this a Tele or Face-to-Face Assessment?: Tele Assessment Is this an Initial Assessment or a Re-assessment for this encounter?: Initial Assessment Living Arrangements: Children (Pt has 6 children) Can pt return to current living arrangement?: Yes Admission Status: Voluntary Is patient capable of signing voluntary admission?: Yes Transfer from: [facility] Referral Source: Self/Family/Friend     [place] Living Arrangements: Children (Pt has 6 children) Name of Psychiatrist:  Dr. Mirna Carter at [facility] Name of Therapist: N/A     Risk to self Suicidal Ideation: No Suicidal Intent: No Is patient at risk for suicide?: No Suicidal Plan?: No Access to Means: No What has been your use of drugs/alcohol within the last 12 months?: Daily ETOH Previous Attempts/Gestures: Yes How many times?: 3 Other Self Harm Risks: None Triggers for Past Attempts: Unknown Intentional Self Injurious Behavior: None Family Suicide History: No Recent stressful life event(s): Recent negative physical changes (Chronic back pain) Persecutory voices/beliefs?: No Depression: No Depression Symptoms:  (Pt denies depressive symptoms at this time.) Substance abuse history and/or treatment for substance abuse?: Yes Suicide prevention information given to non-admitted patients: Not applicable  Risk to Others Homicidal Ideation: No Thoughts of Harm to Others: No Current Homicidal Intent: No Current Homicidal Plan: No Access to Homicidal Means: No Identified Victim: No one History of harm to others?: Yes Assessment of Violence: On admission Violent Behavior Description: Pt calm and cooperative Does patient have access to weapons?: No Criminal Charges Pending?: No Does patient have a court date: No  Psychosis Hallucinations: None noted (Pt denies previous report of hearing voices.) Delusions: None noted  Mental Status Report Appear/Hygiene: Disheveled Eye Contact: Good Motor Activity: Freedom of movement;Unremarkable Speech: Logical/coherent Level of Consciousness: Alert Mood: Anxious Affect: Appropriate to circumstance;Anxious Anxiety Level: Minimal Thought Processes: Coherent;Relevant Judgement: Unimpaired Orientation: Person;Place;Time;Situation Obsessive Compulsive Thoughts/Behaviors: None  Cognitive Functioning Concentration: Normal Memory: Recent Impaired;Remote Impaired IQ: Average Insight: Fair Impulse Control: Poor Appetite: Fair Weight Loss: 0 Weight Gain:  0 Sleep: Decreased Total Hours of Sleep:  (4H/D) Vegetative Symptoms: None  ADLScreening [facility] Services) Patient's cognitive ability adequate to safely complete daily activities?: Yes Patient able to express need for assistance with ADLs?: Yes Independently performs ADLs?: Yes (appropriate for developmental age)  Prior Inpatient Therapy Prior Inpatient Therapy: Yes Prior Therapy Dates: Unsure of dates Prior Therapy Facilty/Provider(s): [facility] Reason for Treatment: depression  Prior Outpatient Therapy Prior Outpatient Therapy: Yes Prior Therapy Dates: For over a year Prior Therapy Facilty/Provider(s): [facility] in [place] Reason for Treatment: Med management  ADL Screening (condition at time of admission) Patient's cognitive ability adequate to safely complete daily activities?: Yes Is the patient deaf or have difficulty hearing?: No Does the patient have difficulty seeing, even when wearing glasses/contacts?: No Does the patient have difficulty concentrating, remembering, or making decisions?: No Patient able to express need for assistance with ADLs?: Yes Does the patient have difficulty dressing or bathing?: No Independently performs ADLs?: Yes (appropriate for developmental age) Does the patient have difficulty walking or climbing stairs?: No Weakness of Legs: None Weakness of Arms/Hands: None       Abuse/Neglect Assessment (Assessment to be complete while patient is alone) Physical Abuse: Denies Verbal Abuse: Denies Sexual Abuse: Denies Exploitation of patient/patient's resources: Denies Self-Neglect: Denies Values / Beliefs Cultural Requests During Hospitalization: None Spiritual Requests During Hospitalization: None   Advance  Directives (For Healthcare) Advance Directive: Patient does not have advance directive;Patient would not like information Pre-existing out of facility DNR order (yellow form or pink MOST form): No    Additional Information 1:1 In  Past 12 Months?: No CIRT Risk: No Elopement Risk: No Does patient have medical clearance?: Yes     Disposition:  Disposition Initial Assessment Completed for this Encounter: Yes Disposition of Patient: Outpatient treatment;Referred to Type of outpatient treatment: Adult Patient referred to: Other (Comment) (Current provider [facility]))  Tera Helper 06/22/2013 6:56 AM

## 2013-06-22 NOTE — Discharge Instructions (Signed)
Keep your appointments with the resource person and your psychiatrist. Do not drink alcohol.   Alcohol Intoxication Alcohol intoxication occurs when the amount of alcohol that a person has consumed impairs his or her ability to mentally and physically function. Alcohol directly impairs the normal chemical activity of the brain. Drinking large amounts of alcohol can lead to changes in mental function and behavior, and it can cause many physical effects that can be harmful.  Alcohol intoxication can range in severity from mild to very severe. Various factors can affect the level of intoxication that occurs, such as the person's age, gender, weight, frequency of alcohol consumption, and the presence of other medical conditions (such as diabetes, seizures, or heart conditions). Dangerous levels of alcohol intoxication may occur when people drink large amounts of alcohol in a short period (binge drinking). Alcohol can also be especially dangerous when combined with certain prescription medicines or "recreational" drugs. SIGNS AND SYMPTOMS Some common signs and symptoms of mild alcohol intoxication include:  Loss of coordination.  Changes in mood and behavior.  Impaired judgment.  Slurred speech. As alcohol intoxication progresses to more severe levels, other signs and symptoms will appear. These may include:  Vomiting.  Confusion and impaired memory.  Slowed breathing.  Seizures.  Loss of consciousness. DIAGNOSIS  Your health care provider will take a medical history and perform a physical exam. You will be asked about the amount and type of alcohol you have consumed. Blood tests will be done to measure the concentration of alcohol in your blood. In many places, your blood alcohol level must be lower than 80 mg/dL (0.08%) to legally drive. However, many dangerous effects of alcohol can occur at much lower levels.  TREATMENT  People with alcohol intoxication often do not require treatment.  Most of the effects of alcohol intoxication are temporary, and they go away as the alcohol naturally leaves the body. Your health care provider will monitor your condition until you are stable enough to go home. Fluids are sometimes given through an IV access tube to help prevent dehydration.  HOME CARE INSTRUCTIONS  Do not drive after drinking alcohol.  Stay hydrated. Drink enough water and fluids to keep your urine clear or pale yellow. Avoid caffeine.   Only take over-the-counter or prescription medicines as directed by your health care provider.  SEEK MEDICAL CARE IF:   You have persistent vomiting.   You do not feel better after a few days.  You have frequent alcohol intoxication. Your health care provider can help determine if you should see a substance use treatment counselor. SEEK IMMEDIATE MEDICAL CARE IF:   You become shaky or tremble when you try to stop drinking.   You shake uncontrollably (seizure).   You throw up (vomit) blood. This may be bright red or may look like black coffee grounds.   You have blood in your stool. This may be bright red or may appear as a black, tarry, bad smelling stool.   You become lightheaded or faint.  MAKE SURE YOU:   Understand these instructions.  Will watch your condition.  Will get help right away if you are not doing well or get worse. Document Released: 11/11/2004 Document Revised: 10/04/2012 Document Reviewed: 07/07/2012 [employer] Patient Information 2014 [place].

## 2013-06-22 NOTE — ED Notes (Signed)
Tele psych placed in room.  

## 2013-06-22 NOTE — ED Notes (Signed)
Pt signed contract for safety.

## 2013-06-22 NOTE — ED Notes (Signed)
TTS consult complete  

## 2013-07-30 ENCOUNTER — Other Ambulatory Visit (HOSPITAL_COMMUNITY): Payer: Self-pay | Admitting: Family Medicine

## 2013-07-30 DIAGNOSIS — Z1231 Encounter for screening mammogram for malignant neoplasm of breast: Secondary | ICD-10-CM

## 2013-08-07 ENCOUNTER — Ambulatory Visit (HOSPITAL_COMMUNITY)
Admission: RE | Admit: 2013-08-07 | Discharge: 2013-08-07 | Disposition: A | Discharge status: Home / Self Care | Payer: Medicaid Other | Source: Ambulatory Visit | Attending: Family Medicine | Admitting: Family Medicine

## 2013-08-07 DIAGNOSIS — Z1231 Encounter for screening mammogram for malignant neoplasm of breast: Secondary | ICD-10-CM | POA: Insufficient documentation

## 2013-11-13 ENCOUNTER — Emergency Department (HOSPITAL_COMMUNITY)
Admission: EM | Admit: 2013-11-13 | Discharge: 2013-11-13 | Disposition: A | Discharge status: Home / Self Care | Payer: Medicaid Other | Attending: Emergency Medicine | Admitting: Emergency Medicine

## 2013-11-13 ENCOUNTER — Encounter (HOSPITAL_COMMUNITY): Payer: Self-pay | Admitting: Emergency Medicine

## 2013-11-13 DIAGNOSIS — IMO0002 Reserved for concepts with insufficient information to code with codable children: Secondary | ICD-10-CM | POA: Diagnosis not present

## 2013-11-13 DIAGNOSIS — J45909 Unspecified asthma, uncomplicated: Secondary | ICD-10-CM | POA: Insufficient documentation

## 2013-11-13 DIAGNOSIS — R197 Diarrhea, unspecified: Secondary | ICD-10-CM | POA: Insufficient documentation

## 2013-11-13 DIAGNOSIS — J209 Acute bronchitis, unspecified: Secondary | ICD-10-CM | POA: Insufficient documentation

## 2013-11-13 DIAGNOSIS — Z87891 Personal history of nicotine dependence: Secondary | ICD-10-CM | POA: Insufficient documentation

## 2013-11-13 DIAGNOSIS — J40 Bronchitis, not specified as acute or chronic: Secondary | ICD-10-CM

## 2013-11-13 DIAGNOSIS — R1110 Vomiting, unspecified: Secondary | ICD-10-CM | POA: Insufficient documentation

## 2013-11-13 DIAGNOSIS — R509 Fever, unspecified: Secondary | ICD-10-CM | POA: Diagnosis present

## 2013-11-13 DIAGNOSIS — Z8659 Personal history of other mental and behavioral disorders: Secondary | ICD-10-CM | POA: Insufficient documentation

## 2013-11-13 DIAGNOSIS — Z79899 Other long term (current) drug therapy: Secondary | ICD-10-CM | POA: Diagnosis not present

## 2013-11-13 MED ORDER — PREDNISONE 50 MG PO TABS: ORAL_TABLET | ORAL | Status: DC

## 2013-11-13 MED ORDER — ALBUTEROL SULFATE HFA 108 (90 BASE) MCG/ACT IN AERS
2.0000 | INHALATION_SPRAY | Freq: Once | RESPIRATORY_TRACT | Status: AC
  Administered 2013-11-13: 2 via RESPIRATORY_TRACT
  Filled 2013-11-13: qty 6.7

## 2013-11-13 MED ORDER — AZITHROMYCIN 250 MG PO TABS: ORAL_TABLET | ORAL | Status: DC

## 2013-11-13 MED ORDER — PREDNISONE 50 MG PO TABS
60.0000 mg | ORAL_TABLET | Freq: Once | ORAL | Status: AC
  Administered 2013-11-13: 60 mg via ORAL
  Filled 2013-11-13 (×2): qty 1

## 2013-11-13 MED ORDER — ALBUTEROL SULFATE HFA 108 (90 BASE) MCG/ACT IN AERS: 1.0000 | INHALATION_SPRAY | Freq: Four times a day (QID) | RESPIRATORY_TRACT | Status: DC | PRN

## 2013-11-13 MED ORDER — AZITHROMYCIN 250 MG PO TABS
500.0000 mg | ORAL_TABLET | Freq: Once | ORAL | Status: AC
  Administered 2013-11-13: 500 mg via ORAL
  Filled 2013-11-13: qty 2

## 2013-11-13 NOTE — ED Notes (Addendum)
Pharmacy at the bedside

## 2013-11-13 NOTE — ED Notes (Signed)
Respiratory at the bedside

## 2013-11-13 NOTE — ED Notes (Signed)
PT c/o fever up to 104.0 as stated at home x4 days with sore throat and upper congestion with yellow sputum. PT took ibuprofen approx about an hour prior to ED arrival.

## 2013-11-13 NOTE — ED Notes (Signed)
Patient given discharge instruction, verbalized understand. Patient ambulatory out of the department.  

## 2013-11-13 NOTE — ED Provider Notes (Signed)
CSN: [id number]     Arrival date & time 11/13/13  1355 History  This chart was scribed for Sharyon Cable, MD by Delphia Grates, ED Scribe. This patient was seen in room APFT21/APFT21 and the patient's care was started at 2:07 PM.    Chief Complaint  Patient presents with  . Fever     Patient is a 43 y.o. female presenting with fever. The history is provided by the patient. No language interpreter was used.  Fever Max temp prior to arrival:  104 Duration:  7 days Timing:  Constant Progression:  Unchanged Chronicity:  New Relieved by:  Ibuprofen Associated symptoms: congestion, cough, diarrhea, sore throat and vomiting     HPI Comments: Dawn Carter is a 43 y.o. female who presents to the Emergency Department complaining of fever onset 1 week ago. Patient reports a fever of 104 F last night. There is associated cough productive of yellow sputum, vomiting (immediately after EtOH consumption; 1 beer), diarrhea, sore throat, and congestion. Patient also reports back aches, but states this is baseline. She has taken Advil with some relief of fever (last dose PTA). Patient has history of asthma, but does not use an inhaler. She is a former smoker and denies history of DM.     Past Medical History  Diagnosis Date  . Asthma   . Depression    History reviewed. No pertinent past surgical history. No family history on file. History  Substance Use Topics  . Smoking status: Former Research scientist (life sciences)  . Smokeless tobacco: Not on file  . Alcohol Use: Yes     Comment: beer   OB History   Grav Para Term Preterm Abortions TAB SAB Ect Mult Living                 Review of Systems  Constitutional: Positive for fever.  HENT: Positive for congestion and sore throat.   Respiratory: Positive for cough.   Gastrointestinal: Positive for vomiting and diarrhea.      Allergies  Coconut fatty acids and Chantix  Home Medications   Prior to Admission medications   Medication Sig Start Date End  Date Taking? Authorizing Provider  albuterol (PROAIR HFA) 108 (90 BASE) MCG/ACT inhaler Inhale 2 puffs into the lungs every 6 (six) hours as needed for wheezing or shortness of breath.     Historical Provider, MD  diazepam (VALIUM) 10 MG tablet Take 5 mg by mouth every 6 (six) hours as needed for anxiety.    Historical Provider, MD  fluticasone (FLONASE) 50 MCG/ACT nasal spray Place 1 spray into both nostrils daily.    Historical Provider, MD  Fluticasone-Salmeterol (ADVAIR) 250-50 MCG/DOSE AEPB Inhale 1 puff into the lungs 2 (two) times daily.    Historical Provider, MD   Triage Vitals: BP 118/64  Pulse 100  Temp(Src) 99.1 F (37.3 C) (Oral)  Resp 18  Ht 5\' 8"  (1.727 m)  Wt 224 lb (101.606 kg)  BMI 34.07 kg/m2  SpO2 94%  LMP 11/07/2013  Physical Exam CONSTITUTIONAL: Well developed/well nourished HEAD: Normocephalic/atraumatic EYES: EOMI/PERRL ENMT: Mucous membranes moist. Uvula midline. No exudates noted. NECK: supple no meningeal signs SPINE:entire spine nontender CV: S1/S2 noted, no murmurs/rubs/gallops noted LUNGS: Coarse wheezing bilaterally. No distress noted. She is able to speak to me clearly ABDOMEN: soft, nontender, no rebound or guarding GU:no cva tenderness NEURO: Pt is awake/alert, moves all extremitiesx4 EXTREMITIES: pulses normal, full ROM SKIN: warm, color normal PSYCH: no abnormalities of mood noted  ED Course  Procedures  DIAGNOSTIC STUDIES: Oxygen Saturation is 94% on room air, normal by my interpretation.    COORDINATION OF CARE: At 1412 Discussed treatment plan with patient which includes albuterol, prednisone, and azithromycin. Patient agrees.   Pt feels improved after albuterol treatment Her wheezing has improved She is in no distress Pt would like to go home.  Suspect this is mostly bronchitis.   Appropriate for d/c home  MDM   Final diagnoses:  Bronchitis    Nursing notes including past medical history and social history reviewed and  considered in documentation   I personally performed the services described in this documentation, which was scribed in my presence. The recorded information has been reviewed and is accurate.      Sharyon Cable, MD 11/13/13 [phone]

## 2014-01-14 ENCOUNTER — Encounter (HOSPITAL_COMMUNITY): Payer: Self-pay | Admitting: *Deleted

## 2014-01-14 ENCOUNTER — Emergency Department (HOSPITAL_COMMUNITY)
Admission: EM | Admit: 2014-01-14 | Discharge: 2014-01-14 | Disposition: A | Discharge status: Home / Self Care | Payer: Medicaid Other | Attending: Emergency Medicine | Admitting: Emergency Medicine

## 2014-01-14 DIAGNOSIS — F329 Major depressive disorder, single episode, unspecified: Secondary | ICD-10-CM | POA: Diagnosis not present

## 2014-01-14 DIAGNOSIS — Z7952 Long term (current) use of systemic steroids: Secondary | ICD-10-CM | POA: Diagnosis not present

## 2014-01-14 DIAGNOSIS — Z792 Long term (current) use of antibiotics: Secondary | ICD-10-CM | POA: Diagnosis not present

## 2014-01-14 DIAGNOSIS — Z87891 Personal history of nicotine dependence: Secondary | ICD-10-CM | POA: Insufficient documentation

## 2014-01-14 DIAGNOSIS — J45901 Unspecified asthma with (acute) exacerbation: Secondary | ICD-10-CM | POA: Diagnosis not present

## 2014-01-14 DIAGNOSIS — Z79899 Other long term (current) drug therapy: Secondary | ICD-10-CM | POA: Diagnosis not present

## 2014-01-14 DIAGNOSIS — H9209 Otalgia, unspecified ear: Secondary | ICD-10-CM | POA: Insufficient documentation

## 2014-01-14 DIAGNOSIS — G4700 Insomnia, unspecified: Secondary | ICD-10-CM | POA: Diagnosis not present

## 2014-01-14 DIAGNOSIS — J029 Acute pharyngitis, unspecified: Secondary | ICD-10-CM | POA: Diagnosis present

## 2014-01-14 DIAGNOSIS — J039 Acute tonsillitis, unspecified: Secondary | ICD-10-CM

## 2014-01-14 HISTORY — DX: Dorsalgia, unspecified: M54.9

## 2014-01-14 MED ORDER — NYSTATIN-TRIAMCINOLONE 100000-0.1 UNIT/GM-% EX CREA: 1.0000 "application " | TOPICAL_CREAM | Freq: Two times a day (BID) | CUTANEOUS | Status: DC

## 2014-01-14 MED ORDER — BENZONATATE 200 MG PO CAPS: 200.0000 mg | ORAL_CAPSULE | Freq: Three times a day (TID) | ORAL | Status: DC | PRN

## 2014-01-14 MED ORDER — MAGIC MOUTHWASH W/LIDOCAINE: 10.0000 mL | Freq: Four times a day (QID) | ORAL | Status: DC | PRN

## 2014-01-14 MED ORDER — AMOXICILLIN 500 MG PO CAPS: 500.0000 mg | ORAL_CAPSULE | Freq: Three times a day (TID) | ORAL | Status: AC

## 2014-01-14 NOTE — Discharge Instructions (Signed)

## 2014-01-14 NOTE — ED Provider Notes (Signed)
CSN: [id number]     Arrival date & time 01/14/14  1051 History  This chart was scribed for Evalee Jefferson, PA-C with Maudry Diego, MD by Edison Simon, ED Scribe. This patient was seen in room APFT20/APFT20 and the patient's care was started at 1:05 PM.    Chief Complaint  Patient presents with  . Sore Throat   The history is provided by the patient. No language interpreter was used.    HPI Comments: Dawn Carter is a 43 y.o. female who presents to the Emergency Department complaining of sore throat with onset 4 days ago. She reports associated subjective fever last night, for which she used Advil with improvement. She also reports associated chills, sinus congestion, rhinorrhea, and ear pain. She states her nasal discharge is clear. She states she has been having difficulty sleeping. She states she has been using OTC medication including "sinus pills, cold and flu pills, cough drops, generic Daytime, and Advil," but her sore throat has continued to worsen. She denies sick contacts in her family. She denies known exposure to strep throat. She states she has cut back on smoking and now uses 1 pack of cigarettes a week. She denies shortness of breath and chest pain.  Past Medical History  Diagnosis Date  . Asthma   . Depression   . Back pain    History reviewed. No pertinent past surgical history. No family history on file. History  Substance Use Topics  . Smoking status: Former Research scientist (life sciences)  . Smokeless tobacco: Not on file  . Alcohol Use: Yes     Comment: beer   OB History    No data available     Review of Systems  Constitutional: Positive for fever and chills.  HENT: Positive for congestion, ear pain and sore throat.   Eyes: Negative.   Respiratory: Negative for chest tightness and shortness of breath.   Cardiovascular: Negative for chest pain.  Gastrointestinal: Negative for nausea and abdominal pain.  Genitourinary: Negative.   Musculoskeletal: Negative for joint swelling,  arthralgias and neck pain.  Skin: Negative.  Negative for rash and wound.  Neurological: Negative for dizziness, weakness, light-headedness, numbness and headaches.  Psychiatric/Behavioral: Positive for sleep disturbance.      Allergies  Coconut fatty acids and Chantix  Home Medications   Prior to Admission medications   Medication Sig Start Date End Date Taking? Authorizing Provider  albuterol (PROVENTIL HFA;VENTOLIN HFA) 108 (90 BASE) MCG/ACT inhaler Inhale 1-2 puffs into the lungs every 6 (six) hours as needed for wheezing or shortness of breath. 11/13/13  Yes Sharyon Cable, MD  fluticasone (FLONASE) 50 MCG/ACT nasal spray Place 1 spray into both nostrils daily.   Yes Historical Provider, MD  Fluticasone-Salmeterol (ADVAIR) 250-50 MCG/DOSE AEPB Inhale 1 puff into the lungs 2 (two) times daily.   Yes Historical Provider, MD  HYDROcodone-acetaminophen (NORCO/VICODIN) 5-325 MG per tablet Take 1 tablet by mouth 3 (three) times daily.   Yes Historical Provider, MD  lamoTRIgine (LAMICTAL) 25 MG tablet Take 50 mg by mouth daily.   Yes Historical Provider, MD  Nutritional Supplements (COLD AND FLU PO) Take 1 tablet by mouth 2 (two) times daily as needed (sore throat).   Yes Historical Provider, MD  Pseudoeph-Doxylamine-DM-APAP (NIGHT TIME COLD FORMULA PO) Take 30 mLs by mouth at bedtime as needed (sleep).   Yes Historical Provider, MD  Pseudoephedrine HCl (SINUS DECONGESTANT PO) Take 2 tablets by mouth daily as needed (sore throat).   Yes Historical Provider, MD  Alum & Mag Hydroxide-Simeth (MAGIC MOUTHWASH W/LIDOCAINE) SOLN Take 10 mLs by mouth 4 (four) times daily as needed (throat pain). 01/14/14   Evalee Jefferson, PA-C  amoxicillin (AMOXIL) 500 MG capsule Take 1 capsule (500 mg total) by mouth 3 (three) times daily. 01/14/14 01/24/14  Evalee Jefferson, PA-C  azithromycin (ZITHROMAX) 250 MG tablet One tablet PO daily for 4 days Patient not taking: Reported on 01/14/2014 11/13/13   Sharyon Cable,  MD  benzonatate (TESSALON) 200 MG capsule Take 1 capsule (200 mg total) by mouth 3 (three) times daily as needed for cough. 01/14/14   Evalee Jefferson, PA-C  predniSONE (DELTASONE) 50 MG tablet One tablet PO daily for 4 days Patient not taking: Reported on 01/14/2014 11/13/13   Sharyon Cable, MD   BP 125/76 mmHg  Pulse 100  Temp(Src) 100.5 F (38.1 C) (Oral)  Resp 18  Ht 5\' 8"  (1.727 m)  Wt 214 lb (97.07 kg)  BMI 32.55 kg/m2  LMP 01/02/2014 Physical Exam  Constitutional: She is oriented to person, place, and time. She appears well-developed and well-nourished.  HENT:  Head: Normocephalic and atraumatic.  Right Ear: Tympanic membrane and ear canal normal.  Left Ear: Tympanic membrane and ear canal normal.  Nose: Mucosal edema and rhinorrhea present.  Mouth/Throat: Uvula is midline, oropharynx is clear and moist and mucous membranes are normal. No oropharyngeal exudate or tonsillar abscesses.  2+ bilateral tonsillar edema and erythema with soft palate petechiae No exudate  Uvula midline She has bilateral tonsillar adenopathy  Eyes: Conjunctivae are normal.  Cardiovascular: Normal rate and normal heart sounds.   Pulmonary/Chest: Effort normal. No respiratory distress. She has wheezes (occasional expiratory wheeze in left lower lung which ealrs with deep inspiration). She has no rales.  Abdominal: Soft. There is no tenderness.  Musculoskeletal: Normal range of motion.  Neurological: She is alert and oriented to person, place, and time.  Skin: Skin is warm and dry. No rash noted.  Psychiatric: She has a normal mood and affect.  Nursing note and vitals reviewed.   ED Course  Procedures (including critical care time)  COORDINATION OF CARE: 1:17 PM Discussed treatment plan with patient at beside, including treating with antibiotics and Tessalon Perles. The patient agrees with the plan and has no further questions at this time.   Labs Review Labs Reviewed - No data to  display  Imaging Review No results found.   EKG Interpretation None      MDM   Final diagnoses:  Acute tonsillitis    Amoxil, magic mouthwash, tessalon for cough suppression.  Exam c/w with acute tonsillitis, although pt has sx supporting viral illness.  Tonsils are beefy, enlarged with adenopathy, will cover for bacterial source.  Advised f/u with pcp if not improved.  I personally performed the services described in this documentation, which was scribed in my presence. The recorded information has been reviewed and is accurate.    Evalee Jefferson, PA-C 01/15/14 [place], MD 01/15/14 [phone]

## 2014-01-14 NOTE — ED Notes (Signed)
Sore throat for 4 days, throat is red. Has been using otc meds for pain

## 2014-01-14 NOTE — ED Notes (Signed)
Sore throat since Thursday

## 2014-03-18 ENCOUNTER — Emergency Department (HOSPITAL_COMMUNITY)
Admission: EM | Admit: 2014-03-18 | Discharge: 2014-03-18 | Disposition: A | Discharge status: Home / Self Care | Payer: Medicaid Other | Attending: Emergency Medicine | Admitting: Emergency Medicine

## 2014-03-18 ENCOUNTER — Encounter (HOSPITAL_COMMUNITY): Payer: Self-pay | Admitting: *Deleted

## 2014-03-18 DIAGNOSIS — Z7952 Long term (current) use of systemic steroids: Secondary | ICD-10-CM | POA: Diagnosis not present

## 2014-03-18 DIAGNOSIS — J45909 Unspecified asthma, uncomplicated: Secondary | ICD-10-CM | POA: Diagnosis not present

## 2014-03-18 DIAGNOSIS — Z7951 Long term (current) use of inhaled steroids: Secondary | ICD-10-CM | POA: Insufficient documentation

## 2014-03-18 DIAGNOSIS — R21 Rash and other nonspecific skin eruption: Secondary | ICD-10-CM | POA: Diagnosis not present

## 2014-03-18 DIAGNOSIS — F329 Major depressive disorder, single episode, unspecified: Secondary | ICD-10-CM | POA: Insufficient documentation

## 2014-03-18 DIAGNOSIS — Z720 Tobacco use: Secondary | ICD-10-CM | POA: Diagnosis not present

## 2014-03-18 DIAGNOSIS — R220 Localized swelling, mass and lump, head: Secondary | ICD-10-CM | POA: Diagnosis present

## 2014-03-18 DIAGNOSIS — T7840XA Allergy, unspecified, initial encounter: Secondary | ICD-10-CM | POA: Diagnosis not present

## 2014-03-18 DIAGNOSIS — Z79899 Other long term (current) drug therapy: Secondary | ICD-10-CM | POA: Diagnosis not present

## 2014-03-18 MED ORDER — PREDNISONE 50 MG PO TABS
60.0000 mg | ORAL_TABLET | Freq: Once | ORAL | Status: AC
  Administered 2014-03-18: 60 mg via ORAL
  Filled 2014-03-18 (×2): qty 1

## 2014-03-18 MED ORDER — PREDNISONE 50 MG PO TABS
ORAL_TABLET | ORAL | Status: DC
Start: 2014-03-18 — End: 2014-07-18

## 2014-03-18 MED ORDER — EPINEPHRINE 0.3 MG/0.3ML IJ SOAJ: 0.3000 mg | Freq: Once | INTRAMUSCULAR | Status: AC

## 2014-03-18 MED ORDER — DIPHENHYDRAMINE HCL 50 MG/ML IJ SOLN
25.0000 mg | Freq: Once | INTRAMUSCULAR | Status: AC
  Administered 2014-03-18: 25 mg via INTRAMUSCULAR
  Filled 2014-03-18: qty 1

## 2014-03-18 MED ORDER — DIPHENHYDRAMINE HCL 25 MG PO CAPS
25.0000 mg | ORAL_CAPSULE | Freq: Once | ORAL | Status: AC
  Administered 2014-03-18: 25 mg via ORAL
  Filled 2014-03-18: qty 1

## 2014-03-18 MED ORDER — ALBUTEROL SULFATE HFA 108 (90 BASE) MCG/ACT IN AERS
2.0000 | INHALATION_SPRAY | Freq: Once | RESPIRATORY_TRACT | Status: AC
  Administered 2014-03-18: 2 via RESPIRATORY_TRACT
  Filled 2014-03-18: qty 6.7

## 2014-03-18 NOTE — ED Provider Notes (Signed)
CSN: [id number]     Arrival date & time 03/18/14  2121 History  This chart was scribed for Sharyon Cable, MD by Chester Holstein, ED Scribe. This patient was seen in room APA04/APA04 and the patient's care was started at 10:13 PM.    Chief Complaint  Patient presents with  . Facial Swelling    Patient is a 44 y.o. female presenting with allergic reaction. The history is provided by the patient. No language interpreter was used.  Allergic Reaction Presenting symptoms: itching, rash and swelling   Presenting symptoms: no difficulty breathing and no difficulty swallowing   Itching:    Location:  Arm, chest and face   Severity:  Moderate   Onset quality:  Sudden   Duration:  1 hour   Timing:  Constant   Progression:  Worsening Rash:    Location:  Chest   Quality: itchiness and redness     Severity:  Moderate   Onset quality:  Sudden   Timing:  Constant   Progression:  Worsening Severity:  Moderate Prior allergic episodes:  No prior episodes  HPI Comments: Dawn Carter is a 44 y.o. female who presents to the Emergency Department complaining of allergic reaction with associated facial swelling of bilateral eyes, mostly of right eye with onset around 9 PM. Pt notes associated generalized itching especially of arms, chest, and nose with rhinorrhea and rash to chest.Pt denies change in diet, visual disturbances, dyspnea or difficulty swallowing and any urinary changes.   Past Medical History  Diagnosis Date  . Asthma   . Depression   . Back pain    History reviewed. No pertinent past surgical history. History reviewed. No pertinent family history. History  Substance Use Topics  . Smoking status: Current Every Day Smoker  . Smokeless tobacco: Not on file  . Alcohol Use: Yes     Comment: beer   OB History    No data available     Review of Systems  Constitutional: Negative for fever.  HENT: Positive for facial swelling and rhinorrhea. Negative for trouble swallowing.    Eyes: Negative for visual disturbance.  Respiratory: Negative for shortness of breath.   Gastrointestinal: Negative for diarrhea.  Skin: Positive for itching and rash.  Neurological: Negative for syncope.  All other systems reviewed and are negative.     Allergies  Coconut fatty acids; Percocet; and Chantix  Home Medications   Prior to Admission medications   Medication Sig Start Date End Date Taking? Authorizing Provider  albuterol (PROVENTIL HFA;VENTOLIN HFA) 108 (90 BASE) MCG/ACT inhaler Inhale 1-2 puffs into the lungs every 6 (six) hours as needed for wheezing or shortness of breath. 11/13/13   Sharyon Cable, MD  Alum & Mag Hydroxide-Simeth (MAGIC MOUTHWASH W/LIDOCAINE) SOLN Take 10 mLs by mouth 4 (four) times daily as needed (throat pain). 01/14/14   Evalee Jefferson, PA-C  azithromycin (ZITHROMAX) 250 MG tablet One tablet PO daily for 4 days Patient not taking: Reported on 01/14/2014 11/13/13   Sharyon Cable, MD  benzonatate (TESSALON) 200 MG capsule Take 1 capsule (200 mg total) by mouth 3 (three) times daily as needed for cough. 01/14/14   Evalee Jefferson, PA-C  fluticasone (FLONASE) 50 MCG/ACT nasal spray Place 1 spray into both nostrils daily.    Historical Provider, MD  Fluticasone-Salmeterol (ADVAIR) 250-50 MCG/DOSE AEPB Inhale 1 puff into the lungs 2 (two) times daily.    Historical Provider, MD  HYDROcodone-acetaminophen (NORCO/VICODIN) 5-325 MG per tablet Take 1 tablet by  mouth 3 (three) times daily.    Historical Provider, MD  lamoTRIgine (LAMICTAL) 25 MG tablet Take 50 mg by mouth daily.    Historical Provider, MD  Nutritional Supplements (COLD AND FLU PO) Take 1 tablet by mouth 2 (two) times daily as needed (sore throat).    Historical Provider, MD  predniSONE (DELTASONE) 50 MG tablet One tablet PO daily for 4 days Patient not taking: Reported on 01/14/2014 11/13/13   Sharyon Cable, MD  Pseudoeph-Doxylamine-DM-APAP (NIGHT TIME COLD FORMULA PO) Take 30 mLs by mouth at  bedtime as needed (sleep).    Historical Provider, MD  Pseudoephedrine HCl (SINUS DECONGESTANT PO) Take 2 tablets by mouth daily as needed (sore throat).    Historical Provider, MD   BP 148/91 mmHg  Pulse 86  Temp(Src) 99 F (37.2 C) (Oral)  Resp 20  Ht 5\' 8"  (1.727 m)  Wt 219 lb (99.338 kg)  BMI 33.31 kg/m2  SpO2 98%  LMP 02/24/2014 Physical Exam  Nursing note and vitals reviewed.   CONSTITUTIONAL: Well developed/well nourished HEAD: Normocephalic/atraumatic EYES: EOMI/PERRL, bilateral periorbital edema right greater than left, no erythema and no proptosis ENMT: Mucous membranes moist, no tongue or lip swelling NECK: supple no meningeal signs SPINE/BACK:entire spine nontender CV: S1/S2 noted, no murmurs/rubs/gallops noted LUNGS: Lungs are clear to auscultation bilaterally, no apparent distress ABDOMEN: soft, nontender, no rebound or guarding, bowel sounds noted throughout abdomen GU:no cva tenderness NEURO: Pt is awake/alert/appropriate, moves all extremitiesx4.  No facial droop.   EXTREMITIES: pulses normal/equal, full ROM SKIN: warm, color normal, urticaria to chest PSYCH: no abnormalities of mood noted, alert and oriented to situation   ED Course  Procedures DIAGNOSTIC STUDIES: Oxygen Saturation is 98% on room air, normal by my interpretation.    COORDINATION OF CARE: 10:17 PM Discussed treatment plan with patient at beside, the patient agrees with the plan and has no further questions at this time.  11:35 PM Pt feeling improved No tongue/lip swelling noted She feels rash improved She would like to be discharged She requested albuterol - reports h/o asthma and feels she needs one but does not feel acutely SOB Will give Rx for epipen given history of sudden/occult allergic rxn - we discussed appropriate use of epipen    Medications  diphenhydrAMINE (BENADRYL) capsule 25 mg (25 mg Oral Given 03/18/14 2214)  predniSONE (DELTASONE) tablet 60 mg (60 mg Oral Given 03/18/14  2221)  diphenhydrAMINE (BENADRYL) injection 25 mg (25 mg Intramuscular Given 03/18/14 2222)  albuterol (PROVENTIL HFA;VENTOLIN HFA) 108 (90 BASE) MCG/ACT inhaler 2 puff (2 puffs Inhalation Given 03/18/14 2254)    MDM   Final diagnoses:  Allergic reaction, initial encounter    Nursing notes including past medical history and social history reviewed and considered in documentation   I personally performed the services described in this documentation, which was scribed in my presence. The recorded information has been reviewed and is accurate.       Sharyon Cable, MD 03/18/14 [phone]

## 2014-03-18 NOTE — ED Notes (Addendum)
Lt eye swollen .  And nose itching

## 2014-07-18 ENCOUNTER — Encounter (HOSPITAL_COMMUNITY): Payer: Self-pay | Admitting: Emergency Medicine

## 2014-07-18 ENCOUNTER — Emergency Department (HOSPITAL_COMMUNITY): Payer: Medicaid Other

## 2014-07-18 ENCOUNTER — Emergency Department (HOSPITAL_COMMUNITY)
Admission: EM | Admit: 2014-07-18 | Discharge: 2014-07-18 | Disposition: A | Discharge status: Home / Self Care | Payer: Medicaid Other | Attending: Emergency Medicine | Admitting: Emergency Medicine

## 2014-07-18 DIAGNOSIS — Z720 Tobacco use: Secondary | ICD-10-CM | POA: Diagnosis not present

## 2014-07-18 DIAGNOSIS — Z7951 Long term (current) use of inhaled steroids: Secondary | ICD-10-CM | POA: Insufficient documentation

## 2014-07-18 DIAGNOSIS — Z79899 Other long term (current) drug therapy: Secondary | ICD-10-CM | POA: Diagnosis not present

## 2014-07-18 DIAGNOSIS — T7840XA Allergy, unspecified, initial encounter: Secondary | ICD-10-CM | POA: Diagnosis not present

## 2014-07-18 DIAGNOSIS — J45901 Unspecified asthma with (acute) exacerbation: Secondary | ICD-10-CM | POA: Diagnosis not present

## 2014-07-18 DIAGNOSIS — F329 Major depressive disorder, single episode, unspecified: Secondary | ICD-10-CM | POA: Diagnosis not present

## 2014-07-18 DIAGNOSIS — R220 Localized swelling, mass and lump, head: Secondary | ICD-10-CM | POA: Insufficient documentation

## 2014-07-18 DIAGNOSIS — R0602 Shortness of breath: Secondary | ICD-10-CM | POA: Insufficient documentation

## 2014-07-18 LAB — BASIC METABOLIC PANEL
ANION GAP: 10 (ref 5–15)
BUN: 15 mg/dL (ref 6–20)
CO2: 23 mmol/L (ref 22–32)
Calcium: 9.2 mg/dL (ref 8.9–10.3)
Chloride: 104 mmol/L (ref 101–111)
Creatinine, Ser: 0.74 mg/dL (ref 0.44–1.00)
GFR calc Af Amer: >60 mL/min (ref >60)
GFR calc non Af Amer: >60 mL/min (ref >60)
Glucose, Bld: 136 mg/dL — ABNORMAL HIGH (ref 65–99)
Potassium: 4 mmol/L (ref 3.5–5.1)
Sodium: 137 mmol/L (ref 135–145)

## 2014-07-18 LAB — CBC WITH DIFFERENTIAL/PLATELET
BASOS PCT: 0 % (ref 0–1)
Basophils Absolute: 0 10*3/uL (ref 0.0–0.1)
EOS ABS: 0 10*3/uL (ref 0.0–0.7)
Eosinophils Relative: 0 % (ref 0–5)
HEMATOCRIT: 45.3 % (ref 36.0–46.0)
HEMOGLOBIN: 14.8 g/dL (ref 12.0–15.0)
LYMPHS ABS: 2.8 10*3/uL (ref 0.7–4.0)
Lymphocytes Relative: 40 % (ref 12–46)
MCH: 29.2 pg (ref 26.0–34.0)
MCHC: 32.7 g/dL (ref 30.0–36.0)
MCV: 89.5 fL (ref 78.0–100.0)
MONOS PCT: 5 % (ref 3–12)
Monocytes Absolute: 0.4 10*3/uL (ref 0.1–1.0)
NEUTROS PCT: 55 % (ref 43–77)
Neutro Abs: 3.9 10*3/uL (ref 1.7–7.7)
PLATELETS: 256 10*3/uL (ref 150–400)
RBC: 5.06 MIL/uL (ref 3.87–5.11)
RDW: 13.8 % (ref 11.5–15.5)
WBC: 7 10*3/uL (ref 4.0–10.5)

## 2014-07-18 LAB — TROPONIN I

## 2014-07-18 MED ORDER — AZITHROMYCIN 250 MG PO TABS: 250.0000 mg | ORAL_TABLET | Freq: Every day | ORAL | Status: DC

## 2014-07-18 MED ORDER — SODIUM CHLORIDE 0.9 % IV BOLUS (SEPSIS)
1000.0000 mL | Freq: Once | INTRAVENOUS | Status: AC
  Administered 2014-07-18: 1000 mL via INTRAVENOUS

## 2014-07-18 MED ORDER — IPRATROPIUM-ALBUTEROL 0.5-2.5 (3) MG/3ML IN SOLN
3.0000 mL | Freq: Once | RESPIRATORY_TRACT | Status: AC
  Administered 2014-07-18: 3 mL via RESPIRATORY_TRACT
  Filled 2014-07-18: qty 3

## 2014-07-18 MED ORDER — DIPHENHYDRAMINE HCL 50 MG/ML IJ SOLN
25.0000 mg | Freq: Once | INTRAMUSCULAR | Status: AC
  Administered 2014-07-18: 25 mg via INTRAVENOUS
  Filled 2014-07-18: qty 1

## 2014-07-18 MED ORDER — FAMOTIDINE IN NACL 20-0.9 MG/50ML-% IV SOLN
20.0000 mg | Freq: Once | INTRAVENOUS | Status: AC
  Administered 2014-07-18: 20 mg via INTRAVENOUS
  Filled 2014-07-18: qty 50

## 2014-07-18 MED ORDER — PREDNISONE 50 MG PO TABS: ORAL_TABLET | ORAL | Status: DC

## 2014-07-18 MED ORDER — IOHEXOL 350 MG/ML SOLN
100.0000 mL | Freq: Once | INTRAVENOUS | Status: AC | PRN
  Administered 2014-07-18: 100 mL via INTRAVENOUS

## 2014-07-18 MED ORDER — DEXTROSE 5 % IV SOLN
500.0000 mg | Freq: Once | INTRAVENOUS | Status: AC
  Administered 2014-07-18: 500 mg via INTRAVENOUS
  Filled 2014-07-18: qty 500

## 2014-07-18 MED ORDER — DEXTROSE 5 % IV SOLN
1.0000 g | Freq: Once | INTRAVENOUS | Status: AC
  Administered 2014-07-18: 1 g via INTRAVENOUS
  Filled 2014-07-18: qty 10

## 2014-07-18 MED ORDER — ALBUTEROL SULFATE HFA 108 (90 BASE) MCG/ACT IN AERS
INHALATION_SPRAY | RESPIRATORY_TRACT | Status: AC
  Administered 2014-07-18: 20:00:00
  Filled 2014-07-18: qty 6.7

## 2014-07-18 MED ORDER — ALBUTEROL SULFATE HFA 108 (90 BASE) MCG/ACT IN AERS: 2.0000 | INHALATION_SPRAY | Freq: Four times a day (QID) | RESPIRATORY_TRACT | Status: AC | PRN

## 2014-07-18 NOTE — ED Notes (Signed)
HR 88-98 while ambulating. O2 sat 91-94 while ambulating.

## 2014-07-18 NOTE — ED Notes (Signed)
RT paged for breathing tx

## 2014-07-18 NOTE — ED Provider Notes (Signed)
CSN: [id number]     Arrival date & time 07/18/14  1413 History   First MD Initiated Contact with Patient 07/18/14 1415     Chief Complaint  Patient presents with  . Facial Swelling     (Consider location/radiation/quality/duration/timing/severity/associated sxs/prior Treatment) HPI Comments: Patient arrives via EMS with allergic reaction. She states she was walking in the woods when all of a sudden she became itchy, short of breath, swelling to her face and tongue. She gave herself an EpiPen prior to EMS arrival at about 1320. EMS that she was wheezing and gave her DuoNeb, Benadryl, solumedrol. Patient does not remember being bitten or stung by anything. She reports facial swelling several months ago to unknown allergen. Today's reaction is worse. She states she is breathing better and denies any chest pain. She denies any abdominal pain, nausea, vomiting or diarrhea. She denies any loss of consciousness. She denies any visual change. She denies any new medications. She is not taking any Ace inhibitors.  The history is provided by the patient and the EMS personnel. The history is limited by the condition of the patient.    Past Medical History  Diagnosis Date  . Asthma   . Depression   . Back pain    History reviewed. No pertinent past surgical history. History reviewed. No pertinent family history. History  Substance Use Topics  . Smoking status: Current Every Day Smoker  . Smokeless tobacco: Not on file  . Alcohol Use: Yes     Comment: beer   OB History    No data available     Review of Systems  Constitutional: Negative for fever, activity change and appetite change.  HENT: Positive for facial swelling and voice change.   Eyes: Negative for visual disturbance.  Respiratory: Positive for shortness of breath. Negative for chest tightness.   Gastrointestinal: Negative for nausea, vomiting and abdominal pain.  Genitourinary: Negative for dysuria, vaginal bleeding and vaginal  discharge.  Musculoskeletal: Negative for myalgias and arthralgias.  Skin: Positive for rash.  Neurological: Negative for dizziness, weakness and headaches.  A complete 10 system review of systems was obtained and all systems are negative except as noted in the HPI and PMH.      Allergies  Coconut fatty acids; Percocet; and Chantix  Home Medications   Prior to Admission medications   Medication Sig Start Date End Date Taking? Authorizing Provider  EPINEPHrine (EPIPEN 2-PAK) 0.3 mg/0.3 mL IJ SOAJ injection Inject 0.3 mLs (0.3 mg total) into the muscle once. 03/18/14  Yes Ripley Fraise, MD  fluticasone [facility]) 50 MCG/ACT nasal spray Place 1 spray into both nostrils daily.   Yes Historical Provider, MD  Fluticasone-Salmeterol (ADVAIR) 250-50 MCG/DOSE AEPB Inhale 1 puff into the lungs 2 (two) times daily.   Yes Historical Provider, MD  HYDROcodone-acetaminophen (NORCO/VICODIN) 5-325 MG per tablet Take 1 tablet by mouth 3 (three) times daily.   Yes Historical Provider, MD  lamoTRIgine (LAMICTAL) 25 MG tablet Take 25 mg by mouth 2 (two) times daily.    Yes Historical Provider, MD  albuterol (PROVENTIL HFA;VENTOLIN HFA) 108 (90 BASE) MCG/ACT inhaler Inhale 2 puffs into the lungs every 6 (six) hours as needed for wheezing or shortness of breath. 07/18/14   Ezequiel Essex, MD  Alum & Mag Hydroxide-Simeth (MAGIC MOUTHWASH W/LIDOCAINE) SOLN Take 10 mLs by mouth 4 (four) times daily as needed (throat pain). Patient not taking: Reported on 07/18/2014 01/14/14   Evalee Jefferson, PA-C  azithromycin (ZITHROMAX) 250 MG tablet Take 1 tablet (  250 mg total) by mouth daily. Take first 2 tablets together, then 1 every day until finished. 07/18/14   Ezequiel Essex, MD  benzonatate (TESSALON) 200 MG capsule Take 1 capsule (200 mg total) by mouth 3 (three) times daily as needed for cough. Patient not taking: Reported on 07/18/2014 01/14/14   Evalee Jefferson, PA-C  predniSONE (DELTASONE) 50 MG tablet 1 tablet PO daily 07/18/14    Ezequiel Essex, MD   BP 133/75 mmHg  Pulse 98  Temp(Src) 98.9 F (37.2 C) (Oral)  Resp 20  Ht 5\' 8"  (1.727 m)  Wt 224 lb (101.606 kg)  BMI 34.07 kg/m2  SpO2 96%  LMP 07/03/2014 Physical Exam  Constitutional: She is oriented to person, place, and time. She appears well-developed and well-nourished.  HENT:  Head: Normocephalic and atraumatic.  Mouth/Throat: Oropharynx is clear and moist. No oropharyngeal exudate.  Periorbital edema, left greater than right. Mild swelling of the tongue. No lip swelling. Uvula is midline. No swelling of the posterior pharynx.  Eyes: Conjunctivae and EOM are normal.  Neck: Normal range of motion. Neck supple.  Cardiovascular: Normal rate, regular rhythm, normal heart sounds and intact distal pulses.   Pulmonary/Chest: Effort normal. She has wheezes.  Scattered wheezing throughout  Abdominal: Soft. There is no tenderness. There is no rebound and no guarding.  Musculoskeletal: Normal range of motion. She exhibits no edema or tenderness.  Neurological: She is alert and oriented to person, place, and time. No cranial nerve deficit. Coordination normal.  CN 2-12 intact, no ataxia on finger to nose, no nystagmus, 5/5 strength throughout, no pronator drift,   Skin: Skin is warm.    ED Course  Procedures (including critical care time) Labs Review Labs Reviewed  BASIC METABOLIC PANEL - Abnormal; Notable for the following:    Glucose, Bld 136 (*)    All other components within normal limits  CBC WITH DIFFERENTIAL/PLATELET  TROPONIN I    Imaging Review Ct Angio Chest Pe W/cm &/or Wo Cm  07/18/2014   CLINICAL DATA:  History of asthma now with wheezing and swelling involving her face. History of enlarged mediastinal and hilar lymph nodes. Evaluate for pulmonary embolus.  EXAM: CT ANGIOGRAPHY CHEST WITH CONTRAST  TECHNIQUE: Multidetector CT imaging of the chest was performed using the standard protocol during bolus administration of intravenous contrast.  Multiplanar CT image reconstructions and MIPs were obtained to evaluate the vascular anatomy.  CONTRAST:  164mL OMNIPAQUE IOHEXOL 350 MG/ML SOLN  COMPARISON:  Chest CT- 03/06/2013  FINDINGS: Vascular Findings:  There is adequate opacification of the pulmonary arterial system with the main pulmonary artery measuring 278 Hounsfield units. There no discrete filling defects within the pulmonary arterial tree to the level of the bilateral subsegmental pulmonary arteries. Evaluation of the distal subsegmental pulmonary arteries is degraded secondary to a combination of suboptimal vessel opacification, patient respiratory artifact as well quantum mottle artifact due to patient body habitus. Normal caliber the main pulmonary artery.  Normal heart size. No pericardial effusion. Normal caliber the thoracic aorta. The definite thoracic aortic dissection is nongated examination. The left vertebral artery is noted to arise directly from the aortic arch. The branch vessels of the aortic arch appear patent throughout their imaged course.  Review of the MIP images confirms the above findings.   ----------------------------------------------------------------------------------  Nonvascular Findings:  There is similar-appearing mild diffuse mosaic attenuation of the bilateral pulmonary parenchyma. This finding is associated with mild diffuse bronchial wall thickening and thus is favored to be secondary to airways disease.  No discrete air bronchograms. No pleural effusion or pneumothorax.  Ill-defined ground-glass nodules scattered primarily within the right lower lobe (representative images 61, 66, 64 and 65, series 6) which appear similar to minimally progressed since the 02/2013 examination. These nodules are again noted to demonstrate a centrilobular distribution. Dominant nodule within the subpleural aspect of the right lung apex is unchanged, measuring 0.8 cm in diameter (image 21, series 6).  These findings are again  associated with mediastinal and hilar lymphadenopathy with index prevascular lymph node measuring 1.3 cm in greatest short axis diameter (image 31, series 4), index suprahilar nodal conglomeration measuring approximately 1.3 cm (image 37, series 4), index right left infrahilar nodal conglomeration measuring approximately 1.7 cm (image 46) and index sub carinal lymph node measuring approximate 1.9 cm (image 48). No axillary lymphadenopathy.  Limited early arterial phase evaluation of the upper abdomen is normal.  No acute or aggressive osseous abnormalities. Moderate scoliotic curvature of the thoracolumbar spine.  Regional soft tissues appear normal. Normal appearance of the thyroid gland.  IMPRESSION: 1. No evidence of pulmonary embolism to the level of the bilateral subsegmental pulmonary arteries. 2. Persistent mosaic attenuation of the bilateral lungs with associated mild bronchial wall thickening and scattered ill-defined ground-glass pulmonary nodules with centrilobular distribution - while nonspecific constellation of findings are suggestive of hypersensitivity pneumonitis. 3. Grossly unchanged mediastinal and hilar lymphadenopathy - while potentially attributable to suspected diagnosis of hypersensitivity pneumonitis, pulmonary sarcoidosis could have a similar appearance. As such, if not previously performed, formal referral to pulmonology is recommended.   Electronically Signed   By: Sandi Mariscal M.D.   On: 07/18/2014 18:12   Dg Chest Portable 1 View  07/18/2014   CLINICAL DATA:  44 year old female with a history of allergic reaction tongue swelling.  EXAM: PORTABLE CHEST - 1 VIEW  COMPARISON:  Chest x-ray 07/23/2013, 06/09/2013, CT 03/06/2013  FINDINGS: Cardiomediastinal silhouette is similar in size and contour to the prior. Atherosclerotic calcifications of the aortic arch.  Airspace opacity of the right lower lobe, with retrocardiac region not well evaluated.  No pneumothorax or pleural effusion.  No  displaced fracture  IMPRESSION: Airspace disease of the right lower lobe, concerning for pneumonia. Alternatively, this may reflect fibrotic changes or volume loss, as this area is in a region of similar appearing airspace disease present on prior CT 03/06/2013, where there was significant hilar adenopathy. If not already performed, a follow-up CT may be considered to evaluate for resolution of lymph nodes.  Signed,  Dulcy Fanny. Earleen Newport, DO  [facility]  [facility]   Electronically Signed   By: Corrie Mckusick D.O.   On: 07/18/2014 14:49     EKG Interpretation   Date/Time:  Thursday July 18 2014 15:02:19 EDT Ventricular Rate:  84 PR Interval:  126 QRS Duration: 88 QT Interval:  357 QTC Calculation: 422 R Axis:   66 Text Interpretation:  Sinus rhythm Atrial premature complex Consider RVH   No significant change was found Confirmed by Wyvonnia Dusky  MD, Armelia Penton [phone])  on 07/18/2014 8:52:32 PM      MDM   Final diagnoses:  Allergic reaction, initial encounter   Allergic reaction with facial and tongue swelling and SOB.  Improved after nebs.  Patient received IM epinephrine, DuoNeb, Benadryl, Solu-Medrol. Mild tongue swelling and periorbital edema. Scattered wheezing on exam. No hypoxia.  Patient will be given IV fluids, additional anti-histamines.  Patient improved. Recheck at 5:30 PM. Her tongue swelling has resolved. She does not feel  short of breath. Discussed abnormal chest x-ray with patient. She does not recall this.  Patient states she has a lung specialist Dr. Tamala Julian in [place]. Attempted to contact Dr. Luan Pulling for pulmonary follow-up. Call not returned.  Patient feels much better. She is tolerating by mouth. She is ambulatory without desaturation. Wheezing has improved. Periorbital edema persists but improving. Observation admission offered which patient declines. We'll treat for allergic reaction with steroids, antihistamine and  refill EpiPen. Patient advised that she needs pulmonary follow-up regarding her abnormal chest x-ray and CT scan. We'll treat with azithromycin. Pulmonary sarcoidosis is considered or possible hypersensitivity pneumonitis.  No  convincing evidence of pNA.  CRITICAL CARE Performed by: Ezequiel Essex Total critical care time: 45 Critical care time was exclusive of separately billable procedures and treating other patients. Critical care was necessary to treat or prevent imminent or life-threatening deterioration. Critical care was time spent personally by me on the following activities: development of treatment plan with patient and/or surrogate as well as nursing, discussions with consultants, evaluation of patient's response to treatment, examination of patient, obtaining history from patient or surrogate, ordering and performing treatments and interventions, ordering and review of laboratory studies, ordering and review of radiographic studies, pulse oximetry and re-evaluation of patient's condition.   Ezequiel Essex, MD 07/18/14 [id number]

## 2014-07-18 NOTE — Discharge Instructions (Signed)
Anaphylactic Reaction Take the steroids and antibiotics as directed as prescribed. Use the epinephrine pen as needed for severe allergic reaction. As discussed, follow up with your lung doctor regarding her abnormal x-ray and CT scan. Return to the ED if you develop new or worsening symptoms. An anaphylactic reaction is a sudden, severe allergic reaction that involves the whole body. It can be life threatening. A hospital stay is often required. People with asthma, eczema, or hay fever are slightly more likely to have an anaphylactic reaction. CAUSES  An anaphylactic reaction may be caused by anything to which you are allergic. After being exposed to the allergic substance, your immune system becomes sensitized to it. When you are exposed to that allergic substance again, an allergic reaction can occur. Common causes of an anaphylactic reaction include:  Medicines.  Foods, especially peanuts, wheat, shellfish, milk, and eggs.  Insect bites or stings.  Blood products.  Chemicals, such as dyes, latex, and contrast material used for imaging tests. SYMPTOMS  When an allergic reaction occurs, the body releases histamine and other substances. These substances cause symptoms such as tightening of the airway. Symptoms often develop within seconds or minutes of exposure. Symptoms may include:  Skin rash or hives.  Itching.  Chest tightness.  Swelling of the eyes, tongue, or lips.  Trouble breathing or swallowing.  Lightheadedness or fainting.  Anxiety or confusion.  Stomach pains, vomiting, or diarrhea.  Nasal congestion.  A fast or irregular heartbeat (palpitations). DIAGNOSIS  Diagnosis is based on your history of recent exposure to allergic substances, your symptoms, and a physical exam. Your caregiver may also perform blood or urine tests to confirm the diagnosis. TREATMENT  Epinephrine medicine is the main treatment for an anaphylactic reaction. Other medicines that may be used  for treatment include antihistamines, steroids, and albuterol. In severe cases, fluids and medicine to support blood pressure may be given through an intravenous line (IV). Even if you improve after treatment, you need to be observed to make sure your condition does not get worse. This may require a stay in the hospital. [place] a medical alert bracelet or necklace stating your allergy.  You and your family must learn how to use an anaphylaxis kit or give an epinephrine injection to temporarily treat an emergency allergic reaction. Always carry your epinephrine injection or anaphylaxis kit with you. This can be lifesaving if you have a severe reaction.  Do not drive or perform tasks after treatment until the medicines used to treat your reaction have worn off, or until your caregiver says it is okay.  If you have hives or a rash:  Take medicines as directed by your caregiver.  You may use an over-the-counter antihistamine (diphenhydramine) as needed.  Apply cold compresses to the skin or take baths in cool water. Avoid hot baths or showers. SEEK MEDICAL CARE IF:   You develop symptoms of an allergic reaction to a new substance. Symptoms may start right away or minutes later.  You develop a rash, hives, or itching.  You develop new symptoms. SEEK IMMEDIATE MEDICAL CARE IF:   You have swelling of the mouth, difficulty breathing, or wheezing.  You have a tight feeling in your chest or throat.  You develop hives, swelling, or itching all over your body.  You develop severe vomiting or diarrhea.  You feel faint or pass out. This is an emergency. Use your epinephrine injection or anaphylaxis kit as you have been instructed. Call your local  emergency services (911 in [place] Even if you improve after the injection, you need to be examined at a hospital emergency department. MAKE SURE YOU:   Understand these instructions.  Will watch your condition.  Will get  help right away if you are not doing well or get worse. Document Released: 02/01/2005 Document Revised: 02/06/2013 Document Reviewed: 05/05/2011 [facility] Patient Information 2015 [place], [place]. This information is not intended to replace advice given to you by your health care provider. Make sure you discuss any questions you have with your health care provider.

## 2014-07-18 NOTE — ED Notes (Signed)
Called EMS at 1322 with itching over entire body.  Pt had walker through the woods and when returning home she was itching and started with swelling about 1320 started with swelling to face.  Denies pain, just c/o itching.   Pt gave self EpiPen prior to EMS arrival.  EMS says pt was wheezing and was given Duo-neb, IV Benadryl 50 mg, and 125 mg of IV solumedrol.

## 2014-07-18 NOTE — ED Notes (Signed)
Assumed care of patient from [place], [place]. PT lying on stretcher resting quietly at this time, reports feeling "much" better - periorbital edema present - but patient reports it has improved - no distress, denies pain. Pt has eaten hospital dinner tray, watching tv, call bell in reach, stretcher in lowest position and sr up.

## 2015-05-09 ENCOUNTER — Ambulatory Visit (HOSPITAL_COMMUNITY)
Admission: RE | Admit: 2015-05-09 | Discharge: 2015-05-09 | Disposition: A | Discharge status: Home / Self Care | Payer: Medicaid Other | Source: Ambulatory Visit | Attending: Nurse Practitioner | Admitting: Nurse Practitioner

## 2015-05-09 ENCOUNTER — Other Ambulatory Visit (HOSPITAL_COMMUNITY): Payer: Self-pay | Admitting: Nurse Practitioner

## 2015-05-09 DIAGNOSIS — M5031 Other cervical disc degeneration,  high cervical region: Secondary | ICD-10-CM | POA: Diagnosis not present

## 2015-05-09 DIAGNOSIS — M47812 Spondylosis without myelopathy or radiculopathy, cervical region: Secondary | ICD-10-CM | POA: Diagnosis not present

## 2017-01-17 ENCOUNTER — Other Ambulatory Visit (HOSPITAL_COMMUNITY): Payer: Self-pay | Admitting: Neurology

## 2017-01-17 ENCOUNTER — Ambulatory Visit (HOSPITAL_COMMUNITY)
Admission: RE | Admit: 2017-01-17 | Discharge: 2017-01-17 | Disposition: A | Discharge status: Home / Self Care | Payer: Medicaid Other | Source: Ambulatory Visit | Attending: Neurology | Admitting: Neurology

## 2017-01-17 DIAGNOSIS — M79642 Pain in left hand: Secondary | ICD-10-CM

## 2017-01-20 ENCOUNTER — Other Ambulatory Visit (HOSPITAL_COMMUNITY): Payer: Self-pay | Admitting: Neurology

## 2017-01-20 DIAGNOSIS — M542 Cervicalgia: Secondary | ICD-10-CM

## 2017-01-20 DIAGNOSIS — M5412 Radiculopathy, cervical region: Secondary | ICD-10-CM

## 2017-02-04 ENCOUNTER — Other Ambulatory Visit (HOSPITAL_COMMUNITY): Payer: Self-pay | Admitting: Family Medicine

## 2017-02-04 DIAGNOSIS — Z1231 Encounter for screening mammogram for malignant neoplasm of breast: Secondary | ICD-10-CM

## 2017-02-14 ENCOUNTER — Inpatient Hospital Stay (HOSPITAL_COMMUNITY): Admission: RE | Admit: 2017-02-14 | Payer: Medicaid Other | Source: Ambulatory Visit

## 2017-02-17 ENCOUNTER — Ambulatory Visit (HOSPITAL_COMMUNITY)
Admission: RE | Admit: 2017-02-17 | Discharge: 2017-02-17 | Disposition: A | Discharge status: Home / Self Care | Payer: Medicaid Other | Source: Ambulatory Visit | Attending: Neurology | Admitting: Neurology

## 2017-02-17 DIAGNOSIS — M5412 Radiculopathy, cervical region: Secondary | ICD-10-CM | POA: Diagnosis present

## 2017-02-17 DIAGNOSIS — M4802 Spinal stenosis, cervical region: Secondary | ICD-10-CM | POA: Insufficient documentation

## 2017-02-17 DIAGNOSIS — M542 Cervicalgia: Secondary | ICD-10-CM

## 2017-03-24 ENCOUNTER — Other Ambulatory Visit (HOSPITAL_COMMUNITY)
Admission: RE | Admit: 2017-03-24 | Discharge: 2017-03-24 | Disposition: A | Discharge status: Home / Self Care | Payer: Medicaid Other | Source: Ambulatory Visit | Attending: Adult Health | Admitting: Adult Health

## 2017-03-24 ENCOUNTER — Encounter: Payer: Self-pay | Admitting: Adult Health

## 2017-03-24 ENCOUNTER — Other Ambulatory Visit: Payer: Self-pay

## 2017-03-24 ENCOUNTER — Ambulatory Visit: Payer: Medicaid Other | Admitting: Adult Health

## 2017-03-24 VITALS — BP 104/68 | HR 84 | Resp 20 | Ht 68.0 in | Wt 227.0 lb

## 2017-03-24 DIAGNOSIS — Z1211 Encounter for screening for malignant neoplasm of colon: Secondary | ICD-10-CM

## 2017-03-24 DIAGNOSIS — R109 Unspecified abdominal pain: Secondary | ICD-10-CM

## 2017-03-24 DIAGNOSIS — Z124 Encounter for screening for malignant neoplasm of cervix: Secondary | ICD-10-CM

## 2017-03-24 DIAGNOSIS — N946 Dysmenorrhea, unspecified: Secondary | ICD-10-CM

## 2017-03-24 DIAGNOSIS — Z0001 Encounter for general adult medical examination with abnormal findings: Secondary | ICD-10-CM

## 2017-03-24 DIAGNOSIS — N920 Excessive and frequent menstruation with regular cycle: Secondary | ICD-10-CM | POA: Diagnosis not present

## 2017-03-24 DIAGNOSIS — Z1212 Encounter for screening for malignant neoplasm of rectum: Secondary | ICD-10-CM | POA: Insufficient documentation

## 2017-03-24 DIAGNOSIS — Z01419 Encounter for gynecological examination (general) (routine) without abnormal findings: Secondary | ICD-10-CM | POA: Insufficient documentation

## 2017-03-24 LAB — HEMOCCULT GUIAC POC 1CARD (OFFICE): FECAL OCCULT BLD: NEGATIVE

## 2017-03-24 NOTE — Patient Instructions (Signed)
Endometrial Ablation Endometrial ablation is a procedure that destroys the thin inner layer of the lining of the uterus (endometrium). This procedure may be done:  To stop heavy periods.  To stop bleeding that is causing anemia.  To control irregular bleeding.  To treat bleeding caused by small tumors (fibroids) in the endometrium.  This procedure is often an alternative to major surgery, such as removal of the uterus and cervix (hysterectomy). As a result of this procedure:  You may not be able to have children. However, if you are premenopausal (you have not gone through menopause): ? You may still have a small chance of getting pregnant. ? You will need to use a reliable method of birth control after the procedure to prevent pregnancy.  You may stop having a menstrual period, or you may have only a small amount of bleeding during your period. Menstruation may return several years after the procedure.  Tell a health care provider about:  Any allergies you have.  All medicines you are taking, including vitamins, herbs, eye drops, creams, and over-the-counter medicines.  Any problems you or family members have had with the use of anesthetic medicines.  Any blood disorders you have.  Any surgeries you have had.  Any medical conditions you have. What are the risks? Generally, this is a safe procedure. However, problems may occur, including:  A hole (perforation) in the uterus or bowel.  Infection of the uterus, bladder, or vagina.  Bleeding.  Damage to other structures or organs.  An air bubble in the lung (air embolus).  Problems with pregnancy after the procedure.  Failure of the procedure.  Decreased ability to diagnose cancer in the endometrium.  What happens before the procedure?  You will have tests of your endometrium to make sure there are no pre-cancerous cells or cancer cells present.  You may have an ultrasound of the uterus.  You may be given  medicines to thin the endometrium.  Ask your health care provider about: ? Changing or stopping your regular medicines. This is especially important if you take diabetes medicines or blood thinners. ? Taking medicines such as aspirin and ibuprofen. These medicines can thin your blood. Do not take these medicines before your procedure if your doctor tells you not to.  Plan to have someone take you home from the hospital or clinic. What happens during the procedure?  You will lie on an exam table with your feet and legs supported as in a pelvic exam.  To lower your risk of infection: ? Your health care team will wash or sanitize their hands and put on germ-free (sterile) gloves. ? Your genital area will be washed with soap.  An IV tube will be inserted into one of your veins.  You will be given a medicine to help you relax (sedative).  A surgical instrument with a light and camera (resectoscope) will be inserted into your vagina and moved into your uterus. This allows your surgeon to see inside your uterus.  Endometrial tissue will be removed using one of the following methods: ? Radiofrequency. This method uses a radiofrequency-alternating electric current to remove the endometrium. ? Cryotherapy. This method uses extreme cold to freeze the endometrium. ? Heated-free liquid. This method uses a heated saltwater (saline) solution to remove the endometrium. ? Microwave. This method uses high-energy microwaves to heat up the endometrium and remove it. ? Thermal balloon. This method involves inserting a catheter with a balloon tip into the uterus. The balloon tip is   filled with heated fluid to remove the endometrium. The procedure may vary among health care providers and hospitals. What happens after the procedure?  Your blood pressure, heart rate, breathing rate, and blood oxygen level will be monitored until the medicines you were given have worn off.  As tissue healing occurs, you may  notice vaginal bleeding for 4-6 weeks after the procedure. You may also experience: ? Cramps. ? Thin, watery vaginal discharge that is light pink or brown in color. ? A need to urinate more frequently than usual. ? Nausea.  Do not drive for 24 hours if you were given a sedative.  Do not have sex or insert anything into your vagina until your health care provider approves. Summary  Endometrial ablation is done to treat the many causes of heavy menstrual bleeding.  The procedure may be done only after medications have been tried to control the bleeding.  Plan to have someone take you home from the hospital or clinic. This information is not intended to replace advice given to you by your health care provider. Make sure you discuss any questions you have with your health care provider. Document Released: 12/12/2003 Document Revised: 02/19/2016 Document Reviewed: 02/19/2016 Elsevier Interactive Patient Education  2017 [employer] Laparoscopic Tubal Ligation Laparoscopic tubal ligation is a procedure to close the fallopian tubes. This is done so that you cannot get pregnant. When the fallopian tubes are closed, the eggs that your ovaries release cannot enter the uterus, and sperm cannot reach the released eggs. A laparoscopic tubal ligation is sometimes called "getting your tubes tied." You should not have this procedure if you want to get pregnant someday or if you are unsure about having more children. Tell a health care provider about:  Any allergies you have.  All medicines you are taking, including vitamins, herbs, eye drops, creams, and over-the-counter medicines.  Any problems you or family members have had with anesthetic medicines.  Any blood disorders you have.  Any surgeries you have had.  Any medical conditions you have.  Whether you are pregnant or may be pregnant.  Any past pregnancies. What are the risks? Generally, this is a safe procedure. However, problems may  occur, including:  Infection.  Bleeding.  Injury to surrounding organs.  Side effects from anesthetics.  Failure of the procedure.  This procedure can increase your risk of a kind of pregnancy in which a fertilized egg attaches to the outside of the uterus (ectopic pregnancy). What happens before the procedure?  Ask your health care provider about: ? Changing or stopping your regular medicines. This is especially important if you are taking diabetes medicines or blood thinners. ? Taking medicines such as aspirin and ibuprofen. These medicines can thin your blood. Do not take these medicines before your procedure if your health care provider instructs you not to.  Follow instructions from your health care provider about eating and drinking restrictions.  Plan to have someone take you home after the procedure.  If you go home right after the procedure, plan to have someone with you for 24 hours. What happens during the procedure?  You will be given one or more of the following: ? A medicine to help you relax (sedative). ? A medicine to numb the area (local anesthetic). ? A medicine to make you fall asleep (general anesthetic). ? A medicine that is injected into an area of your body to numb everything below the injection site (regional anesthetic).  An IV tube will be inserted into  one of your veins. It will be used to give you medicines and fluids during the procedure.  Your bladder may be emptied with a small tube (catheter).  If you have been given a general anesthetic, a tube will be put down your throat to help you breathe.  Two small cuts (incisions) will be made in your lower abdomen and near your belly button.  Your abdomen will be inflated with a gas. This will let the surgeon see better and will give the surgeon room to work.  A thin, lighted tube (laparoscope) with a camera attached will be inserted into your abdomen through one of the incisions. Small instruments  will be inserted through the other incision.  The fallopian tubes will be tied off, burned (cauterized), or blocked with a clip, ring, or clamp. A small portion in the center of each fallopian tube may be removed.  The gas will be released from the abdomen.  The incisions will be closed with stitches (sutures).  A bandage (dressing) will be placed over the incisions. The procedure may vary among health care providers and hospitals. What happens after the procedure?  Your blood pressure, heart rate, breathing rate, and blood oxygen level will be monitored often until the medicines you were given have worn off.  You will be given medicine to help with pain, nausea, and vomiting as needed. This information is not intended to replace advice given to you by your health care provider. Make sure you discuss any questions you have with your health care provider. Document Released: 05/10/2000 Document Revised: 07/10/2015 Document Reviewed: 01/12/2015 Elsevier Interactive Patient Education  2018 [employer]. Menorrhagia Menorrhagia is when your menstrual periods are heavy or last longer than usual. Follow these instructions at home:  Only take medicine as told by your doctor.  Take any iron pills as told by your doctor. Heavy bleeding may cause low levels of iron in your body.  Do not take aspirin 1 week before or during your period. Aspirin can make the bleeding worse.  Lie down for a while if you change your tampon or pad more than once in 2 hours. This may help lessen the bleeding.  Eat a healthy diet and foods with iron. These foods include leafy green vegetables, meat, liver, eggs, and whole grain breads and cereals.  Do not try to lose weight. Wait until the heavy bleeding has stopped and your iron level is normal. Contact a doctor if:  You soak through a pad or tampon every 1 or 2 hours, and this happens every time you have a period.  You need to use pads and tampons at the same  time because you are bleeding so much.  You need to change your pad or tampon during the night.  You have a period that lasts for more than 8 days.  You pass clots bigger than 1 inch (2.5 cm) wide.  You have irregular periods that happen more or less often than once a month.  You feel dizzy or pass out (faint).  You feel very weak or tired.  You feel short of breath or feel your heart is beating too fast when you exercise.  You feel sick to your stomach (nausea) and you throw up (vomit) while you are taking your medicine.  You have watery poop (diarrhea) while you are taking your medicine.  You have any problems that may be related to the medicine you are taking. Get help right away if:  You soak through 4  or more pads or tampons in 2 hours.  You have any bleeding while you are pregnant. This information is not intended to replace advice given to you by your health care provider. Make sure you discuss any questions you have with your health care provider. Document Released: 11/11/2007 Document Revised: 07/10/2015 Document Reviewed: 08/03/2012 Elsevier Interactive Patient Education  2017 [employer].

## 2017-03-24 NOTE — Progress Notes (Signed)
Patient ID: Dawn Carter, female   DOB: [birth year], 47 y.o.   MRN: [id number] History of Present Illness: Dawn Carter is a 47 year old black female, G7P6, single, in for well woman gyn exam and pap.She is complaining of heavy periods 5 out of 7 days with cramps for about 2 years.Changes pads every 2 hours or so. She is new pt.  PCP is Dr Maudie Mercury.   Current Medications, Allergies, Past Medical History, Past Surgical History, Family History and Social History were reviewed in [employer] record.     Review of Systems: Patient denies any headaches, hearing loss, fatigue, blurred vision, shortness of breath, chest pain, abdominal pain, problems with bowel movements, urination, or intercourse(has sex maybe once a month or less). No joint pain or mood swings. See HPI for positives.  She is interested in tubal.    Physical Exam:BP 104/68 (BP Location: Right Arm, Patient Position: Sitting, Cuff Size: Large)   Pulse 84   Resp 20   Ht 5\' 8"  (1.727 m)   Wt 227 lb (103 kg)   LMP 03/09/2017   BMI 34.52 kg/m  General:  Well developed, well nourished, no acute distress Skin:  Warm and dry Neck:  Midline trachea, normal thyroid, good ROM, no lymphadenopathy Lungs; Clear to auscultation bilaterally Breast:  No dominant palpable mass, retraction, or nipple discharge Cardiovascular: Regular rate and rhythm Abdomen:  Soft, non tender, no hepatosplenomegaly Pelvic:  External genitalia is normal in appearance, no lesions.  The vagina is normal in appearance. Urethra has no lesions or masses. The cervix is bulbous. Pap with HPV performed. Uterus is felt to be normal size, shape, and contour.  No adnexal masses or tenderness noted.Bladder is non tender, no masses felt. Rectal: Good sphincter tone, no polyps, or hemorrhoids felt.  Hemoccult negative. Extremities/musculoskeletal:  No swelling or varicosities noted, no clubbing or cyanosis Psych:  No mood changes, alert and cooperative,seems  happy PHQ 2 score 1.   Impression: 1. Encounter for gynecological examination with Papanicolaou smear of cervix   2. Routine cervical smear   3. Screening for colorectal cancer   4. Menorrhagia with regular cycle   5. Dysmenorrhea       Plan: GYN [place] in about a week  Then see me 2-3 days later Physical in 1 year Pap in 3 if normal Get mammogram yearly Labs with PCP  Review handouts on menorrhagia, tubal and ablation

## 2017-03-25 LAB — CYTOLOGY - PAP
Adequacy: ABSENT
Diagnosis: NEGATIVE
HPV: NOT DETECTED

## 2017-03-31 ENCOUNTER — Ambulatory Visit (INDEPENDENT_AMBULATORY_CARE_PROVIDER_SITE_OTHER): Payer: Medicaid Other

## 2017-03-31 DIAGNOSIS — N920 Excessive and frequent menstruation with regular cycle: Secondary | ICD-10-CM

## 2017-03-31 DIAGNOSIS — N946 Dysmenorrhea, unspecified: Secondary | ICD-10-CM | POA: Diagnosis not present

## 2017-03-31 DIAGNOSIS — D251 Intramural leiomyoma of uterus: Secondary | ICD-10-CM | POA: Diagnosis not present

## 2017-03-31 NOTE — Progress Notes (Addendum)
PELVIC US TA/TV: homogeneous retroflexed uterus,intramural fibroid mid/ right 1.4 x .9 x .9 cm,EEC 13.6 mm,normal ovaries bilat,ovaries appear mobile,no free fluid,no pain during ultrasound

## 2017-04-04 ENCOUNTER — Encounter: Payer: Self-pay | Admitting: Adult Health

## 2017-04-04 ENCOUNTER — Ambulatory Visit: Payer: Medicaid Other | Admitting: Adult Health

## 2017-04-04 VITALS — BP 104/76 | HR 54 | Ht 68.0 in | Wt 232.0 lb

## 2017-04-04 DIAGNOSIS — N946 Dysmenorrhea, unspecified: Secondary | ICD-10-CM | POA: Diagnosis not present

## 2017-04-04 DIAGNOSIS — N920 Excessive and frequent menstruation with regular cycle: Secondary | ICD-10-CM

## 2017-04-04 DIAGNOSIS — D251 Intramural leiomyoma of uterus: Secondary | ICD-10-CM

## 2017-04-04 DIAGNOSIS — D219 Benign neoplasm of connective and other soft tissue, unspecified: Secondary | ICD-10-CM

## 2017-04-04 HISTORY — DX: Benign neoplasm of connective and other soft tissue, unspecified: D21.9

## 2017-04-04 MED ORDER — MEGESTROL ACETATE 40 MG PO TABS: 40.0000 mg | ORAL_TABLET | Freq: Every day | ORAL | 0 refills | Status: DC

## 2017-04-04 NOTE — Patient Instructions (Signed)
Laparoscopic Tubal Ligation Laparoscopic tubal ligation is a procedure to close the fallopian tubes. This is done so that you cannot get pregnant. When the fallopian tubes are closed, the eggs that your ovaries release cannot enter the uterus, and sperm cannot reach the released eggs. A laparoscopic tubal ligation is sometimes called "getting your tubes tied." You should not have this procedure if you want to get pregnant someday or if you are unsure about having more children. Tell a health care provider about:  Any allergies you have.  All medicines you are taking, including vitamins, herbs, eye drops, creams, and over-the-counter medicines.  Any problems you or family members have had with anesthetic medicines.  Any blood disorders you have.  Any surgeries you have had.  Any medical conditions you have.  Whether you are pregnant or may be pregnant.  Any past pregnancies. What are the risks? Generally, this is a safe procedure. However, problems may occur, including:  Infection.  Bleeding.  Injury to surrounding organs.  Side effects from anesthetics.  Failure of the procedure.  This procedure can increase your risk of a kind of pregnancy in which a fertilized egg attaches to the outside of the uterus (ectopic pregnancy). What happens before the procedure?  Ask your health care provider about: ? Changing or stopping your regular medicines. This is especially important if you are taking diabetes medicines or blood thinners. ? Taking medicines such as aspirin and ibuprofen. These medicines can thin your blood. Do not take these medicines before your procedure if your health care provider instructs you not to.  Follow instructions from your health care provider about eating and drinking restrictions.  Plan to have someone take you home after the procedure.  If you go home right after the procedure, plan to have someone with you for 24 hours. What happens during the  procedure?  You will be given one or more of the following: ? A medicine to help you relax (sedative). ? A medicine to numb the area (local anesthetic). ? A medicine to make you fall asleep (general anesthetic). ? A medicine that is injected into an area of your body to numb everything below the injection site (regional anesthetic).  An IV tube will be inserted into one of your veins. It will be used to give you medicines and fluids during the procedure.  Your bladder may be emptied with a small tube (catheter).  If you have been given a general anesthetic, a tube will be put down your throat to help you breathe.  Two small cuts (incisions) will be made in your lower abdomen and near your belly button.  Your abdomen will be inflated with a gas. This will let the surgeon see better and will give the surgeon room to work.  A thin, lighted tube (laparoscope) with a camera attached will be inserted into your abdomen through one of the incisions. Small instruments will be inserted through the other incision.  The fallopian tubes will be tied off, burned (cauterized), or blocked with a clip, ring, or clamp. A small portion in the center of each fallopian tube may be removed.  The gas will be released from the abdomen.  The incisions will be closed with stitches (sutures).  A bandage (dressing) will be placed over the incisions. The procedure may vary among health care providers and hospitals. What happens after the procedure?  Your blood pressure, heart rate, breathing rate, and blood oxygen level will be monitored often until the medicines you   were given have worn off.  You will be given medicine to help with pain, nausea, and vomiting as needed. This information is not intended to replace advice given to you by your health care provider. Make sure you discuss any questions you have with your health care provider. Document Released: 05/10/2000 Document Revised: 07/10/2015 Document  Reviewed: 01/12/2015 Elsevier Interactive Patient Education  2018 [place]. Endometrial Ablation Endometrial ablation is a procedure that destroys the thin inner layer of the lining of the uterus (endometrium). This procedure may be done:  To stop heavy periods.  To stop bleeding that is causing anemia.  To control irregular bleeding.  To treat bleeding caused by small tumors (fibroids) in the endometrium.  This procedure is often an alternative to major surgery, such as removal of the uterus and cervix (hysterectomy). As a result of this procedure:  You may not be able to have children. However, if you are premenopausal (you have not gone through menopause): ? You may still have a small chance of getting pregnant. ? You will need to use a reliable method of birth control after the procedure to prevent pregnancy.  You may stop having a menstrual period, or you may have only a small amount of bleeding during your period. Menstruation may return several years after the procedure.  Tell a health care provider about:  Any allergies you have.  All medicines you are taking, including vitamins, herbs, eye drops, creams, and over-the-counter medicines.  Any problems you or family members have had with the use of anesthetic medicines.  Any blood disorders you have.  Any surgeries you have had.  Any medical conditions you have. What are the risks? Generally, this is a safe procedure. However, problems may occur, including:  A hole (perforation) in the uterus or bowel.  Infection of the uterus, bladder, or vagina.  Bleeding.  Damage to other structures or organs.  An air bubble in the lung (air embolus).  Problems with pregnancy after the procedure.  Failure of the procedure.  Decreased ability to diagnose cancer in the endometrium.  What happens before the procedure?  You will have tests of your endometrium to make sure there are no pre-cancerous cells or cancer  cells present.  You may have an ultrasound of the uterus.  You may be given medicines to thin the endometrium.  Ask your health care provider about: ? Changing or stopping your regular medicines. This is especially important if you take diabetes medicines or blood thinners. ? Taking medicines such as aspirin and ibuprofen. These medicines can thin your blood. Do not take these medicines before your procedure if your doctor tells you not to.  Plan to have someone take you home from the hospital or clinic. What happens during the procedure?  You will lie on an exam table with your feet and legs supported as in a pelvic exam.  To lower your risk of infection: ? Your health care team will wash or sanitize their hands and put on germ-free (sterile) gloves. ? Your genital area will be washed with soap.  An IV tube will be inserted into one of your veins.  You will be given a medicine to help you relax (sedative).  A surgical instrument with a light and camera (resectoscope) will be inserted into your vagina and moved into your uterus. This allows your surgeon to see inside your uterus.  Endometrial tissue will be removed using one of the following methods: ? Radiofrequency. This method uses a radiofrequency-alternating  electric current to remove the endometrium. ? Cryotherapy. This method uses extreme cold to freeze the endometrium. ? Heated-free liquid. This method uses a heated saltwater (saline) solution to remove the endometrium. ? Microwave. This method uses high-energy microwaves to heat up the endometrium and remove it. ? Thermal balloon. This method involves inserting a catheter with a balloon tip into the uterus. The balloon tip is filled with heated fluid to remove the endometrium. The procedure may vary among health care providers and hospitals. What happens after the procedure?  Your blood pressure, heart rate, breathing rate, and blood oxygen level will be monitored until the  medicines you were given have worn off.  As tissue healing occurs, you may notice vaginal bleeding for 4-6 weeks after the procedure. You may also experience: ? Cramps. ? Thin, watery vaginal discharge that is light pink or brown in color. ? A need to urinate more frequently than usual. ? Nausea.  Do not drive for 24 hours if you were given a sedative.  Do not have sex or insert anything into your vagina until your health care provider approves. Summary  Endometrial ablation is done to treat the many causes of heavy menstrual bleeding.  The procedure may be done only after medications have been tried to control the bleeding.  Plan to have someone take you home from the hospital or clinic. This information is not intended to replace advice given to you by your health care provider. Make sure you discuss any questions you have with your health care provider. Document Released: 12/12/2003 Document Revised: 02/19/2016 Document Reviewed: 02/19/2016 Elsevier Interactive Patient Education  2017 [employer].

## 2017-04-04 NOTE — Progress Notes (Signed)
Subjective:     Patient ID: Dawn Carter, female   DOB: 1970-11-12, 47 y.o.   MRN: [id number]  HPI Dawn Carter is a 47 year old black female in to discuss [place] and options to help with periods.   Review of Systems +heavy, painful periods Reviewed past medical,surgical, social and family history. Reviewed medications and allergies.     Objective:   Physical Exam BP 104/76 (BP Location: Left Arm, Patient Position: Sitting, Cuff Size: Large)   Pulse (!) 54   Ht 5\' 8"  (1.727 m)   Wt 232 lb (105.2 kg)   LMP 03/09/2017   BMI 35.28 kg/m   Talk only reviewed [place] with pt, has small intramural fibroid and normal ovaries. Dicussed options, such as IUD, which she declines and depo and tubal with ablation and she wants the tubal and ablation, will Rx megace to help with next period.And will get her to sign tubal papers today,she does not want to get pregnant ever again, she says.     Assessment:     1. Fibroid   2. Menorrhagia with regular cycle   3. Dysmenorrhea       Plan:     Review handouts on tubal and ablation Sign tubal papers today Return in 4 weeks for pre op Rx megace 40 mg #30 1 daily to start before period to lessen flow

## 2017-05-03 ENCOUNTER — Other Ambulatory Visit: Payer: Self-pay

## 2017-05-03 ENCOUNTER — Ambulatory Visit: Payer: Medicaid Other | Admitting: Obstetrics & Gynecology

## 2017-05-03 ENCOUNTER — Encounter: Payer: Self-pay | Admitting: Obstetrics & Gynecology

## 2017-05-03 VITALS — BP 134/70 | HR 116 | Ht 68.0 in | Wt 230.0 lb

## 2017-05-03 DIAGNOSIS — Z3009 Encounter for other general counseling and advice on contraception: Secondary | ICD-10-CM

## 2017-05-03 DIAGNOSIS — N920 Excessive and frequent menstruation with regular cycle: Secondary | ICD-10-CM | POA: Diagnosis not present

## 2017-05-03 DIAGNOSIS — N946 Dysmenorrhea, unspecified: Secondary | ICD-10-CM

## 2017-05-03 DIAGNOSIS — Z01818 Encounter for other preprocedural examination: Secondary | ICD-10-CM | POA: Diagnosis not present

## 2017-05-03 NOTE — Progress Notes (Signed)
Preoperative History and Physical Preoperative History and Physical  Dawn Carter is a 47 y.o. G7P0016 with Patient's last menstrual period was 05/18/2017. admitted for a laparoscopic bilateral tubal ligation and hysteroscopy uterine curettage Minerva endometrial ablation.  Note from Dawn Carter: 03/24/2017:  Dawn Carter is a 47 year old black female, G7P6, single, in for well woman gyn exam and pap.She is complaining of heavy periods 5 out of 7 days with cramps for about 2 years.Changes pads every 2 hours or so. She is new pt.  She has been managed on megestrol in the meantime and sonogram is normal  PMH:      Past Medical History:  Diagnosis Date  . Anxiety disorder   . Arthritis   . Asthma   . Back pain   . Bipolar 1 disorder ([place])   . COPD (chronic obstructive pulmonary disease) ([place])   . Depression   . Fibroid 04/04/2017  . Hypothyroidism   . Seizures ([place])    1 as child; unknown etiology and on no meds. No more seizures since then.  . Thyroid disease    hypothyroid   PSH:       Past Surgical History:  Procedure Laterality Date  . CERVICAL CONE BIOPSY     POb/GynH:          OB History     Gravida Para Term Preterm AB Living   7 6   1 6     SAB TAB Ectopic Multiple Live Births     1          SH:  Social History        Tobacco Use  . Smoking status: Current Every Day Smoker    Packs/day: 0.25    Years: 20.00    Pack years: 5.00    Types: Cigarettes  . Smokeless tobacco: Never Used  Substance Use Topics  . Alcohol use: Not Currently    Frequency: Never  . Drug use: Yes    Types: Marijuana    Comment: last used 1 week ago.   FH:       Family History  Problem Relation Age of Onset  . Hypertension Paternal Grandfather   . Hypertension Paternal Grandmother   . Diabetes Maternal Grandmother   . Heart attack Maternal Grandmother   . Hypertension Maternal Grandmother   . Hypertension Maternal Grandfather   . Cancer Father    prostate  . Diabetes Mother    . Hypertension Mother   . Heart attack Mother   . Hypertension Brother   . Kidney disease Brother   . Hypertension Sister    Allergies:       Allergies  Allergen Reactions  . Coconut Fatty Acids Anaphylaxis  . Other Hives and Itching    belbucha  . Percocet [Oxycodone-Acetaminophen] Itching    Can take if premedicated with benadryl  . Chantix [Varenicline] Hives, Itching and Rash   Medications:  Current Facility-Administered Medications:  . bupivacaine liposome (EXPAREL) 1.3 % injection 266 mg, 20 mL, Infiltration, Once, Roderick Calo H, MD  . ceFAZolin (ANCEF) IVPB 2g/100 mL premix, 2 g, Intravenous, On Call to OR, Florian Buff, MD  . lactated ringers infusion, , Intravenous, Continuous, Nicanor Alcon, MD, Last Rate: 50 mL/hr at 05/18/17 [phone]  . midazolam (VERSED) injection 0.5-2 mg, 0.5-2 mg, Intravenous, Q5 min PRN, Nicanor Alcon, MD  Review of Systems:  Review of Systems  Constitutional: Negative for fever, chills, weight loss, malaise/fatigue and diaphoresis.  HENT: Negative for  hearing loss, ear pain, nosebleeds, congestion, sore throat, neck pain, tinnitus and ear discharge.  Eyes: Negative for blurred vision, double vision, photophobia, pain, discharge and redness.  Respiratory: Negative for cough, hemoptysis, sputum production, shortness of breath, wheezing and stridor.  Cardiovascular: Negative for chest pain, palpitations, orthopnea, claudication, leg swelling and PND.  Gastrointestinal: Positive for abdominal pain. Negative for heartburn, nausea, vomiting, diarrhea, constipation, blood in stool and melena.  Genitourinary: Negative for dysuria, urgency, frequency, hematuria and flank pain.  Musculoskeletal: Negative for myalgias, back pain, joint pain and falls.  Skin: Negative for itching and rash.  Neurological: Negative for dizziness, tingling, tremors, sensory change, speech change, focal weakness, seizures, loss of consciousness, weakness and headaches.   Endo/Heme/Allergies: Negative for environmental allergies and polydipsia. Does not bruise/bleed easily.  Psychiatric/Behavioral: Negative for depression, suicidal ideas, hallucinations, memory loss and substance abuse. The patient is not nervous/anxious and does not have insomnia.  PHYSICAL EXAM:  Blood pressure (!) 142/75, pulse 94, temperature 99 F (37.2 C), temperature source Oral, resp. rate 17, last menstrual period 05/18/2017, SpO2 98 %.  Vitals reviewed.  Constitutional: She is oriented to person, place, and time. She appears well-developed and well-nourished.  HENT:  Head: Normocephalic and atraumatic.  Right Ear: External ear normal.  Left Ear: External ear normal.  Nose: Nose normal.  Mouth/Throat: Oropharynx is clear and moist.  Eyes: Conjunctivae and EOM are normal. Pupils are equal, round, and reactive to light. Right eye exhibits no discharge. Left eye exhibits no discharge. No scleral icterus.  Neck: Normal range of motion. Neck supple. No tracheal deviation present. No thyromegaly present.  Cardiovascular: Normal rate, regular rhythm, normal heart sounds and intact distal pulses. Exam reveals no gallop and no friction rub.  No murmur heard.  Respiratory: Effort normal and breath sounds normal. No respiratory distress. She has no wheezes. She has no rales. She exhibits no tenderness.  GI: Soft. Bowel sounds are normal. She exhibits no distension and no mass. There is tenderness. There is no rebound and no guarding.  Genitourinary:  Vulva is normal without lesions Vagina is pink moist without discharge Cervix normal in appearance and pap is normal Uterus is normal size, contour, position, consistency, mobility, non-tender Adnexa is negative with normal sized ovaries by sonogram  Musculoskeletal: Normal range of motion. She exhibits no edema and no tenderness.  Neurological: She is alert and oriented to person, place, and time. She has normal reflexes. She displays normal  reflexes. No cranial nerve deficit. She exhibits normal muscle tone. Coordination normal.  Skin: Skin is warm and dry. No rash noted. No erythema. No pallor.  Psychiatric: She has a normal mood and affect. Her behavior is normal. Judgment and thought content normal.  Labs:        Results for orders placed or performed during the hospital encounter of 05/13/17 (from the past 336 hour(s))  CBC   Collection Time: 05/13/17 2:00 PM  Result Value Ref Range   WBC 7.4 4.0 - 10.5 K/uL   RBC 4.61 3.87 - 5.11 MIL/uL   Hemoglobin 12.4 12.0 - 15.0 g/dL   HCT 40.2 36.0 - 46.0 %   MCV 87.2 78.0 - 100.0 fL   MCH 26.9 26.0 - 34.0 pg   MCHC 30.8 30.0 - 36.0 g/dL   RDW 15.0 11.5 - 15.5 %   Platelets 256 150 - 400 K/uL  Comprehensive metabolic panel   Collection Time: 05/13/17 2:00 PM  Result Value Ref Range   Sodium 136 135 - 145  mmol/L   Potassium 3.6 3.5 - 5.1 mmol/L   Chloride 104 101 - 111 mmol/L   CO2 21 (L) 22 - 32 mmol/L   Glucose, Bld 116 (H) 65 - 99 mg/dL   BUN 13 6 - 20 mg/dL   Creatinine, Ser 0.82 0.44 - 1.00 mg/dL   Calcium 9.3 8.9 - 10.3 mg/dL   Total Protein 8.0 6.5 - 8.1 g/dL   Albumin 3.9 3.5 - 5.0 g/dL   AST 24 15 - 41 U/L   ALT 19 14 - 54 U/L   Alkaline Phosphatase 66 38 - 126 U/L   Total Bilirubin 0.5 0.3 - 1.2 mg/dL   GFR calc non Af Amer >60 >60 mL/min   GFR calc Af Amer >60 >60 mL/min   Anion gap 11 5 - 15  hCG, quantitative, pregnancy   Collection Time: 05/13/17 2:00 PM  Result Value Ref Range   hCG, Beta Chain, Quant, S <1 <5 mIU/mL  Rapid HIV screen (HIV 1/2 Ab+Ag)   Collection Time: 05/13/17 2:00 PM  Result Value Ref Range   HIV-1 P24 Antigen - HIV24 NON REACTIVE NON REACTIVE   HIV 1/2 Antibodies NON REACTIVE NON REACTIVE   Interpretation (HIV Ag Ab)      A non reactive test result means that HIV 1 or HIV 2 antibodies and HIV 1 p24 antigen were not detected in the specimen.  Urinalysis, Routine w reflex microscopic   Collection Time: 05/13/17 2:00 PM  Result  Value Ref Range   Color, Urine YELLOW YELLOW   APPearance HAZY (A) CLEAR   Specific Gravity, Urine 1.014 1.005 - 1.030   pH 6.0 5.0 - 8.0   Glucose, UA NEGATIVE NEGATIVE mg/dL   Hgb urine dipstick NEGATIVE NEGATIVE   Bilirubin Urine NEGATIVE NEGATIVE   Ketones, ur NEGATIVE NEGATIVE mg/dL   Protein, ur NEGATIVE NEGATIVE mg/dL   Nitrite NEGATIVE NEGATIVE   Leukocytes, UA TRACE (A) NEGATIVE   RBC / HPF 0-5 0 - 5 RBC/hpf   WBC, UA 6-30 0 - 5 WBC/hpf   Bacteria, UA NONE SEEN NONE SEEN   Squamous Epithelial / LPF 6-30 (A) NONE SEEN   Mucus PRESENT    EKG:     Orders placed or performed during the hospital encounter of 05/13/17  . EKG 12-Lead  . EKG 12-Lead   Imaging Studies:  Imaging Results     Assessment:  Menorrhagia  Dysmenorrhea  Desires permanent sterilization  Plan:  Laparoscopic bilateral tubal ligation and hysteroscopy uterine curettage and Minerva endometrial ablation   Florian Buff 10/17/2017 10:52 AM      Face to face time:  15 minutes  Greater than 50% of the visit time was spent in counseling and coordination of care with the patient.  The summary and outline of the counseling and care coordination is summarized in the note above.   All questions were answered.

## 2017-05-06 ENCOUNTER — Other Ambulatory Visit: Payer: Self-pay | Admitting: Adult Health

## 2017-05-13 ENCOUNTER — Other Ambulatory Visit: Payer: Self-pay

## 2017-05-13 ENCOUNTER — Other Ambulatory Visit: Payer: Self-pay | Admitting: Obstetrics & Gynecology

## 2017-05-13 ENCOUNTER — Encounter (HOSPITAL_COMMUNITY): Payer: Self-pay

## 2017-05-13 ENCOUNTER — Encounter (HOSPITAL_COMMUNITY)
Admission: RE | Admit: 2017-05-13 | Discharge: 2017-05-13 | Disposition: A | Discharge status: Home / Self Care | Payer: Medicaid Other | Source: Ambulatory Visit | Attending: Obstetrics & Gynecology | Admitting: Obstetrics & Gynecology

## 2017-05-13 DIAGNOSIS — Z01810 Encounter for preprocedural cardiovascular examination: Secondary | ICD-10-CM | POA: Diagnosis not present

## 2017-05-13 DIAGNOSIS — Z01812 Encounter for preprocedural laboratory examination: Secondary | ICD-10-CM | POA: Insufficient documentation

## 2017-05-13 HISTORY — DX: Unspecified convulsions: R56.9

## 2017-05-13 HISTORY — DX: Hypothyroidism, unspecified: E03.9

## 2017-05-13 HISTORY — DX: Unspecified osteoarthritis, unspecified site: M19.90

## 2017-05-13 LAB — CBC
HEMATOCRIT: 40.2 % (ref 36.0–46.0)
Hemoglobin: 12.4 g/dL (ref 12.0–15.0)
MCH: 26.9 pg (ref 26.0–34.0)
MCHC: 30.8 g/dL (ref 30.0–36.0)
MCV: 87.2 fL (ref 78.0–100.0)
Platelets: 256 10*3/uL (ref 150–400)
RBC: 4.61 MIL/uL (ref 3.87–5.11)
RDW: 15 % (ref 11.5–15.5)
WBC: 7.4 10*3/uL (ref 4.0–10.5)

## 2017-05-13 LAB — RAPID HIV SCREEN (HIV 1/2 AB+AG)
HIV 1/2 Antibodies: NONREACTIVE
HIV-1 P24 Antigen - HIV24: NONREACTIVE

## 2017-05-13 LAB — COMPREHENSIVE METABOLIC PANEL WITH GFR
ALT: 19 U/L (ref 14–54)
AST: 24 U/L (ref 15–41)
Albumin: 3.9 g/dL (ref 3.5–5.0)
Alkaline Phosphatase: 66 U/L (ref 38–126)
Anion gap: 11 (ref 5–15)
BUN: 13 mg/dL (ref 6–20)
CO2: 21 mmol/L — ABNORMAL LOW (ref 22–32)
Calcium: 9.3 mg/dL (ref 8.9–10.3)
Chloride: 104 mmol/L (ref 101–111)
Creatinine, Ser: 0.82 mg/dL (ref 0.44–1.00)
GFR calc Af Amer: >60 mL/min
GFR calc non Af Amer: >60 mL/min
Glucose, Bld: 116 mg/dL — ABNORMAL HIGH (ref 65–99)
Potassium: 3.6 mmol/L (ref 3.5–5.1)
Sodium: 136 mmol/L (ref 135–145)
Total Bilirubin: 0.5 mg/dL (ref 0.3–1.2)
Total Protein: 8 g/dL (ref 6.5–8.1)

## 2017-05-13 LAB — URINALYSIS, ROUTINE W REFLEX MICROSCOPIC
Bacteria, UA: NONE SEEN
Bilirubin Urine: NEGATIVE
Glucose, UA: NEGATIVE mg/dL
Hgb urine dipstick: NEGATIVE
KETONES UR: NEGATIVE mg/dL
Nitrite: NEGATIVE
PH: 6 (ref 5.0–8.0)
PROTEIN: NEGATIVE mg/dL
Specific Gravity, Urine: 1.014 (ref 1.005–1.030)

## 2017-05-13 LAB — HCG, QUANTITATIVE, PREGNANCY

## 2017-05-13 NOTE — Patient Instructions (Signed)
Dawn Carter  05/13/2017     @PREFPERIOPPHARMACY @   Your procedure is scheduled on  05/18/2017   Report to [facility] at  87  A.M.  Call this number if you have problems the morning of surgery:  [phone]   Remember:  Do not eat food or drink liquids after midnight.  Take these medicines the morning of surgery with A SIP OF WATER  Wellbutrin, valium, cymbalta, synthroid, megace, singulair, zanaflex and tramdol (if needed). Use your inhaler before you come.   Do not wear jewelry, make-up or nail polish.  Do not wear lotions, powders, or perfumes, or deodorant.  Do not shave 48 hours prior to surgery.  Men may shave face and neck.  Do not bring valuables to the hospital.  [facility] is not responsible for any belongings or valuables.  Contacts, dentures or bridgework may not be worn into surgery.  Leave your suitcase in the car.  After surgery it may be brought to your room.  For patients admitted to the hospital, discharge time will be determined by your treatment team.  Patients discharged the day of surgery will not be allowed to drive home.   Name and phone number of your driver:   family Special instructions:  None  Please read over the following fact sheets that you were given. Anesthesia Post-op Instructions and Care and Recovery After Surgery       Laparoscopic Tubal Ligation Laparoscopic tubal ligation is a procedure to close the fallopian tubes. This is done so that you cannot get pregnant. When the fallopian tubes are closed, the eggs that your ovaries release cannot enter the uterus, and sperm cannot reach the released eggs. A laparoscopic tubal ligation is sometimes called "getting your tubes tied." You should not have this procedure if you want to get pregnant someday or if you are unsure about having more children. Tell a health care provider about:  Any allergies you have.  All medicines you are taking, including  vitamins, herbs, eye drops, creams, and over-the-counter medicines.  Any problems you or family members have had with anesthetic medicines.  Any blood disorders you have.  Any surgeries you have had.  Any medical conditions you have.  Whether you are pregnant or may be pregnant.  Any past pregnancies. What are the risks? Generally, this is a safe procedure. However, problems may occur, including:  Infection.  Bleeding.  Injury to surrounding organs.  Side effects from anesthetics.  Failure of the procedure.  This procedure can increase your risk of a kind of pregnancy in which a fertilized egg attaches to the outside of the uterus (ectopic pregnancy). What happens before the procedure?  Ask your health care provider about: ? Changing or stopping your regular medicines. This is especially important if you are taking diabetes medicines or blood thinners. ? Taking medicines such as aspirin and ibuprofen. These medicines can thin your blood. Do not take these medicines before your procedure if your health care provider instructs you not to.  Follow instructions from your health care provider about eating and drinking restrictions.  Plan to have someone take you home after the procedure.  If you go home right after the procedure, plan to have someone with you for 24 hours. What happens during the procedure?  You will be given one or more of the following: ? A medicine to help you  relax (sedative). ? A medicine to numb the area (local anesthetic). ? A medicine to make you fall asleep (general anesthetic). ? A medicine that is injected into an area of your body to numb everything below the injection site (regional anesthetic).  An IV tube will be inserted into one of your veins. It will be used to give you medicines and fluids during the procedure.  Your bladder may be emptied with a small tube (catheter).  If you have been given a general anesthetic, a tube will be put  down your throat to help you breathe.  Two small cuts (incisions) will be made in your lower abdomen and near your belly button.  Your abdomen will be inflated with a gas. This will let the surgeon see better and will give the surgeon room to work.  A thin, lighted tube (laparoscope) with a camera attached will be inserted into your abdomen through one of the incisions. Small instruments will be inserted through the other incision.  The fallopian tubes will be tied off, burned (cauterized), or blocked with a clip, ring, or clamp. A small portion in the center of each fallopian tube may be removed.  The gas will be released from the abdomen.  The incisions will be closed with stitches (sutures).  A bandage (dressing) will be placed over the incisions. The procedure may vary among health care providers and hospitals. What happens after the procedure?  Your blood pressure, heart rate, breathing rate, and blood oxygen level will be monitored often until the medicines you were given have worn off.  You will be given medicine to help with pain, nausea, and vomiting as needed. This information is not intended to replace advice given to you by your health care provider. Make sure you discuss any questions you have with your health care provider. Document Released: 05/10/2000 Document Revised: 07/10/2015 Document Reviewed: 01/12/2015 Elsevier Interactive Patient Education  2018 [employer].    Laparoscopic Tubal Ligation, Care After Refer to this sheet in the next few weeks. These instructions provide you with information about caring for yourself after your procedure. Your health care provider may also give you more specific instructions. Your treatment has been planned according to current medical practices, but problems sometimes occur. Call your health care provider if you have any problems or questions after your procedure. What can I expect after the procedure? After the procedure, it is  common to have:  A sore throat.  Discomfort in your shoulder.  Mild discomfort or cramping in your abdomen.  Gas pains.  Pain or soreness in the area where the surgical cut (incision) was made.  A bloated feeling.  Tiredness.  Nausea.  Vomiting.  Follow these instructions at home: Medicines  Take over-the-counter and prescription medicines only as told by your health care provider.  Do not take aspirin because it can cause bleeding.  Do not drive or operate heavy machinery while taking prescription pain medicine. Activity  Rest for the rest of the day.  Return to your normal activities as told by your health care provider. Ask your health care provider what activities are safe for you. Incision care   Follow instructions from your health care provider about how to take care of your incision. Make sure you: ? Wash your hands with soap and water before you change your bandage (dressing). If soap and water are not available, use hand sanitizer. ? Change your dressing as told by your health care provider. ? Leave stitches (sutures) in  place. They may need to stay in place for 2 weeks or longer.  Check your incision area every day for signs of infection. Check for: ? More redness, swelling, or pain. ? More fluid or blood. ? Warmth. ? Pus or a bad smell. Other Instructions  Do not take baths, swim, or use a hot tub until your health care provider approves. You may take showers.  Keep all follow-up visits as told by your health care provider. This is important.  Have someone help you with your daily household tasks for the first few days. Contact a health care provider if:  You have more redness, swelling, or pain around your incision.  Your incision feels warm to the touch.  You have pus or a bad smell coming from your incision.  The edges of your incision break open after the sutures have been removed.  Your pain does not improve after 2-3 days.  You have a  rash.  You repeatedly become dizzy or light-headed.  Your pain medicine is not helping.  You are constipated. Get help right away if:  You have a fever.  You faint.  You have increasing pain in your abdomen.  You have severe pain in one or both of your shoulders.  You have fluid or blood coming from your sutures or from your vagina.  You have shortness of breath or difficulty breathing.  You have chest pain or leg pain.  You have ongoing nausea, vomiting, or diarrhea. This information is not intended to replace advice given to you by your health care provider. Make sure you discuss any questions you have with your health care provider. Document Released: 08/21/2004 Document Revised: 07/07/2015 Document Reviewed: 01/12/2015 Elsevier Interactive Patient Education  [employer]. ysteroscopy Hysteroscopy is a procedure used for looking inside the womb (uterus). It may be done for various reasons, including:  To evaluate abnormal bleeding, fibroid (benign, noncancerous) tumors, polyps, scar tissue (adhesions), and possibly cancer of the uterus.  To look for lumps (tumors) and other uterine growths.  To look for causes of why a woman cannot get pregnant (infertility), causes of recurrent loss of pregnancy (miscarriages), or a lost intrauterine device (IUD).  To perform a sterilization by blocking the fallopian tubes from inside the uterus.  In this procedure, a thin, flexible tube with a tiny light and camera on the end of it (hysteroscope) is used to look inside the uterus. A hysteroscopy should be done right after a menstrual period to be sure you are not pregnant. LET [facility] CARE PROVIDER KNOW ABOUT:  Any allergies you have.  All medicines you are taking, including vitamins, herbs, eye drops, creams, and over-the-counter medicines.  Previous problems you or members of your family have had with the use of anesthetics.  Any blood disorders you have.  Previous  surgeries you have had.  Medical conditions you have. RISKS AND COMPLICATIONS Generally, this is a safe procedure. However, as with any procedure, complications can occur. Possible complications include:  Putting a hole in the uterus.  Excessive bleeding.  Infection.  Damage to the cervix.  Injury to other organs.  Allergic reaction to medicines.  Too much fluid used in the uterus for the procedure.  BEFORE THE PROCEDURE  Ask your health care provider about changing or stopping any regular medicines.  Do not take aspirin or blood thinners for 1 week before the procedure, or as directed by your health care provider. These can cause bleeding.  If you smoke, do  not smoke for 2 weeks before the procedure.  In some cases, a medicine is placed in the cervix the day before the procedure. This medicine makes the cervix have a larger opening (dilate). This makes it easier for the instrument to be inserted into the uterus during the procedure.  Do not eat or drink anything for at least 8 hours before the surgery.  Arrange for someone to take you home after the procedure. PROCEDURE  You may be given a medicine to relax you (sedative). You may also be given one of the following: ? A medicine that numbs the area around the cervix (local anesthetic). ? A medicine that makes you sleep through the procedure (general anesthetic).  The hysteroscope is inserted through the vagina into the uterus. The camera on the hysteroscope sends a picture to a TV screen. This gives the surgeon a good view inside the uterus.  During the procedure, air or a liquid is put into the uterus, which allows the surgeon to see better.  Sometimes, tissue is gently scraped from inside the uterus. These tissue samples are sent to a lab for testing. What to expect after the procedure  If you had a general anesthetic, you may be groggy for a couple hours after the procedure.  If you had a local anesthetic, you will  be able to go home as soon as you are stable and feel ready.  You may have some cramping. This normally lasts for a couple days.  You may have bleeding, which varies from light spotting for a few days to menstrual-like bleeding for 3-7 days. This is normal.  If your test results are not back during the visit, make an appointment with your health care provider to find out the results. This information is not intended to replace advice given to you by your health care provider. Make sure you discuss any questions you have with your health care provider. Document Released: 05/10/2000 Document Revised: 07/10/2015 Document Reviewed: 08/31/2012 Elsevier Interactive Patient Education  2017 [place]. Hysteroscopy, Care After Refer to this sheet in the next few weeks. These instructions provide you with information on caring for yourself after your procedure. Your health care provider may also give you more specific instructions. Your treatment has been planned according to current medical practices, but problems sometimes occur. Call your health care provider if you have any problems or questions after your procedure. What can I expect after the procedure? After your procedure, it is typical to have the following:  You may have some cramping. This normally lasts for a couple days.  You may have bleeding. This can vary from light spotting for a few days to menstrual-like bleeding for 3-7 days.  Follow these instructions at home:  Rest for the first 1-2 days after the procedure.  Only take over-the-counter or prescription medicines as directed by your health care provider. Do not take aspirin. It can increase the chances of bleeding.  Take showers instead of baths for 2 weeks or as directed by your health care provider.  Do not drive for 24 hours or as directed.  Do not drink alcohol while taking pain medicine.  Do not use tampons, douche, or have sexual intercourse for 2 weeks or until  your health care provider says it is okay.  Take your temperature twice a day for 4-5 days. Write it down each time.  Follow your health care provider's advice about diet, exercise, and lifting.  If you develop constipation, you may: ?  Take a mild laxative if your health care provider approves. ? Add bran foods to your diet. ? Drink enough fluids to keep your urine clear or pale yellow.  Try to have someone with you or available to you for the first 24-48 hours, especially if you were given a general anesthetic.  Follow up with your health care provider as directed. Contact a health care provider if:  You feel dizzy or lightheaded.  You feel sick to your stomach (nauseous).  You have abnormal vaginal discharge.  You have a rash.  You have pain that is not controlled with medicine. Get help right away if:  You have bleeding that is heavier than a normal menstrual period.  You have a fever.  You have increasing cramps or pain, not controlled with medicine.  You have new belly (abdominal) pain.  You pass out.  You have pain in the tops of your shoulders (shoulder strap areas).  You have shortness of breath. This information is not intended to replace advice given to you by your health care provider. Make sure you discuss any questions you have with your health care provider. Document Released: 11/22/2012 Document Revised: 07/10/2015 Document Reviewed: 08/31/2012 Elsevier Interactive Patient Education  2017 [place].  Dilation and Curettage or Vacuum Curettage Dilation and curettage (D&C) and vacuum curettage are minor procedures. A D&C involves stretching (dilation) the cervix and scraping (curettage) the inside lining of the uterus (endometrium). During a D&C, tissue is gently scraped from the endometrium, starting from the top portion of the uterus down to the lowest part of the uterus (cervix). During a vacuum curettage, the lining and tissue in the uterus are  removed with the use of gentle suction. Curettage may be performed to either diagnose or treat a problem. As a diagnostic procedure, curettage is performed to examine tissues from the uterus. A diagnostic curettage may be done if you have:  Irregular bleeding in the uterus.  Bleeding with the development of clots.  Spotting between menstrual periods.  Prolonged menstrual periods or other abnormal bleeding.  Bleeding after menopause.  No menstrual period (amenorrhea).  A change in size and shape of the uterus.  Abnormal endometrial cells discovered during a Pap test.  As a treatment procedure, curettage may be performed for the following reasons:  Removal of an IUD (intrauterine device).  Removal of retained placenta after giving birth.  Abortion.  Miscarriage.  Removal of endometrial polyps.  Removal of uncommon types of noncancerous lumps (fibroids).  Tell a health care provider about:  Any allergies you have, including allergies to prescribed medicine or latex.  All medicines you are taking, including vitamins, herbs, eye drops, creams, and over-the-counter medicines. This is especially important if you take any blood-thinning medicine. Bring a list of all of your medicines to your appointment.  Any problems you or family members have had with anesthetic medicines.  Any blood disorders you have.  Any surgeries you have had.  Your medical history and any medical conditions you have.  Whether you are pregnant or may be pregnant.  Recent vaginal infections you have had.  Recent menstrual periods, bleeding problems you have had, and what form of birth control (contraception) you use. What are the risks? Generally, this is a safe procedure. However, problems may occur, including:  Infection.  Heavy vaginal bleeding.  Allergic reactions to medicines.  Damage to the cervix or other structures or organs.  Development of scar tissue (adhesions) inside the  uterus, which can cause abnormal  amounts of menstrual bleeding. This may make it harder to get pregnant in the future.  A hole (perforation) or puncture in the uterine wall. This is rare.  What happens before the procedure? Staying hydrated Follow instructions from your health care provider about hydration, which may include:  Up to 2 hours before the procedure - you may continue to drink clear liquids, such as water, clear fruit juice, black coffee, and plain tea.  Eating and drinking restrictions Follow instructions from your health care provider about eating and drinking, which may include:  8 hours before the procedure - stop eating heavy meals or foods such as meat, fried foods, or fatty foods.  6 hours before the procedure - stop eating light meals or foods, such as toast or cereal.  6 hours before the procedure - stop drinking milk or drinks that contain milk.  2 hours before the procedure - stop drinking clear liquids. If your health care provider told you to take your medicine(s) on the day of your procedure, take them with only a sip of water.  Medicines  Ask your health care provider about: ? Changing or stopping your regular medicines. This is especially important if you are taking diabetes medicines or blood thinners. ? Taking medicines such as aspirin and ibuprofen. These medicines can thin your blood. Do not take these medicines before your procedure if your health care provider instructs you not to.  You may be given antibiotic medicine to help prevent infection. General instructions  For 24 hours before your procedure, do not: ? Douche. ? Use tampons. ? Use medicines, creams, or suppositories in the vagina. ? Have sexual intercourse.  You may be given a pregnancy test on the day of the procedure.  Plan to have someone take you home from the hospital or clinic.  You may have a blood or urine sample taken.  If you will be going home right after the procedure,  plan to have someone with you for 24 hours. What happens during the procedure?  To reduce your risk of infection: ? Your health care team will wash or sanitize their hands. ? Your skin will be washed with soap.  An IV tube will be inserted into one of your veins.  You will be given one of the following: ? A medicine that numbs the area in and around the cervix (local anesthetic). ? A medicine to make you fall asleep (general anesthetic).  You will lie down on your back, with your feet in foot rests (stirrups).  The size and position of your uterus will be checked.  A lubricated instrument (speculum or Sims retractor) will be inserted into the back side of your vagina. The speculum will be used to hold apart the walls of your vagina so your health care provider can see your cervix.  A tool (tenaculum) will be attached to the lip of the cervix to stabilize it.  Your cervix will be softened and dilated. This may be done by: ? Taking a medicine. ? Having tapered dilators or thin rods (laminaria) or gradual widening instruments (tapered dilators) inserted into your cervix.  A small, sharp, curved instrument (curette) will be used to scrape a small amount of tissue or cells from the endometrium or cervical canal. In some cases, gentle suction is applied with the curette. The curette will then be removed. The cells will be taken to a lab for testing. The procedure may vary among health care providers and hospitals. What happens after the  procedure?  You may have mild cramping, backache, pain, and light bleeding or spotting. You may pass small blood clots from your vagina.  You may have to wear compression stockings. These stockings help to prevent blood clots and reduce swelling in your legs.  Your blood pressure, heart rate, breathing rate, and blood oxygen level will be monitored until the medicines you were given have worn off. Summary  Dilation and curettage (D&C) involves stretching  (dilation) the cervix and scraping (curettage) the inside lining of the uterus (endometrium).  After the procedure, you may have mild cramping, backache, pain, and light bleeding or spotting. You may pass small blood clots from your vagina.  Plan to have someone take you home from the hospital or clinic. This information is not intended to replace advice given to you by your health care provider. Make sure you discuss any questions you have with your health care provider. Document Released: 02/01/2005 Document Revised: 10/19/2015 Document Reviewed: 10/19/2015 Elsevier Interactive Patient Education  2018 [employer].  Dilation and Curettage or Vacuum Curettage, Care After These instructions give you information about caring for yourself after your procedure. Your doctor may also give you more specific instructions. Call your doctor if you have any problems or questions after your procedure. Follow these instructions at home: Activity  Do not drive or use heavy machinery while taking prescription pain medicine.  For 24 hours after your procedure, avoid driving.  Take short walks often, followed by rest periods. Ask your doctor what activities are safe for you. After one or two days, you may be able to return to your normal activities.  Do not lift anything that is heavier than 10 lb (4.5 kg) until your doctor approves.  For at least 2 weeks, or as long as told by your doctor: ? Do not douche. ? Do not use tampons. ? Do not have sex. General instructions  Take over-the-counter and prescription medicines only as told by your doctor. This is very important if you take blood thinning medicine.  Do not take baths, swim, or use a hot tub until your doctor approves. Take showers instead of baths.  Wear compression stockings as told by your doctor.  It is up to you to get the results of your procedure. Ask your doctor when your results will be ready.  Keep all follow-up visits as told by  your doctor. This is important. Contact a doctor if:  You have very bad cramps that get worse or do not get better with medicine.  You have very bad pain in your belly (abdomen).  You cannot drink fluids without throwing up (vomiting).  You get pain in a different part of the area between your belly and thighs (pelvis).  You have bad-smelling discharge from your vagina.  You have a rash. Get help right away if:  You are bleeding a lot from your vagina. A lot of bleeding means soaking more than one sanitary pad in an hour, for 2 hours in a row.  You have clumps of blood (blood clots) coming from your vagina.  You have a fever or chills.  Your belly feels very tender or hard.  You have chest pain.  You have trouble breathing.  You cough up blood.  You feel dizzy.  You feel light-headed.  You pass out (faint).  You have pain in your neck or shoulder area. Summary  Take short walks often, followed by rest periods. Ask your doctor what activities are safe for you. After  one or two days, you may be able to return to your normal activities.  Do not lift anything that is heavier than 10 lb (4.5 kg) until your doctor approves.  Do not take baths, swim, or use a hot tub until your doctor approves. Take showers instead of baths.  Contact your doctor if you have any symptoms of infection, like bad-smelling discharge from your vagina. This information is not intended to replace advice given to you by your health care provider. Make sure you discuss any questions you have with your health care provider. Document Released: 11/11/2007 Document Revised: 10/20/2015 Document Reviewed: 10/20/2015 Elsevier Interactive Patient Education  2017 [place].  Endometrial Ablation Endometrial ablation is a procedure that destroys the thin inner layer of the lining of the uterus (endometrium). This procedure may be done:  To stop heavy periods.  To stop bleeding that is causing  anemia.  To control irregular bleeding.  To treat bleeding caused by small tumors (fibroids) in the endometrium.  This procedure is often an alternative to major surgery, such as removal of the uterus and cervix (hysterectomy). As a result of this procedure:  You may not be able to have children. However, if you are premenopausal (you have not gone through menopause): ? You may still have a small chance of getting pregnant. ? You will need to use a reliable method of birth control after the procedure to prevent pregnancy.  You may stop having a menstrual period, or you may have only a small amount of bleeding during your period. Menstruation may return several years after the procedure.  Tell a health care provider about:  Any allergies you have.  All medicines you are taking, including vitamins, herbs, eye drops, creams, and over-the-counter medicines.  Any problems you or family members have had with the use of anesthetic medicines.  Any blood disorders you have.  Any surgeries you have had.  Any medical conditions you have. What are the risks? Generally, this is a safe procedure. However, problems may occur, including:  A hole (perforation) in the uterus or bowel.  Infection of the uterus, bladder, or vagina.  Bleeding.  Damage to other structures or organs.  An air bubble in the lung (air embolus).  Problems with pregnancy after the procedure.  Failure of the procedure.  Decreased ability to diagnose cancer in the endometrium.  What happens before the procedure?  You will have tests of your endometrium to make sure there are no pre-cancerous cells or cancer cells present.  You may have an ultrasound of the uterus.  You may be given medicines to thin the endometrium.  Ask your health care provider about: ? Changing or stopping your regular medicines. This is especially important if you take diabetes medicines or blood thinners. ? Taking medicines such as  aspirin and ibuprofen. These medicines can thin your blood. Do not take these medicines before your procedure if your doctor tells you not to.  Plan to have someone take you home from the hospital or clinic. What happens during the procedure?  You will lie on an exam table with your feet and legs supported as in a pelvic exam.  To lower your risk of infection: ? Your health care team will wash or sanitize their hands and put on germ-free (sterile) gloves. ? Your genital area will be washed with soap.  An IV tube will be inserted into one of your veins.  You will be given a medicine to help you relax (sedative).  A surgical instrument with a light and camera (resectoscope) will be inserted into your vagina and moved into your uterus. This allows your surgeon to see inside your uterus.  Endometrial tissue will be removed using one of the following methods: ? Radiofrequency. This method uses a radiofrequency-alternating electric current to remove the endometrium. ? Cryotherapy. This method uses extreme cold to freeze the endometrium. ? Heated-free liquid. This method uses a heated saltwater (saline) solution to remove the endometrium. ? Microwave. This method uses high-energy microwaves to heat up the endometrium and remove it. ? Thermal balloon. This method involves inserting a catheter with a balloon tip into the uterus. The balloon tip is filled with heated fluid to remove the endometrium. The procedure may vary among health care providers and hospitals. What happens after the procedure?  Your blood pressure, heart rate, breathing rate, and blood oxygen level will be monitored until the medicines you were given have worn off.  As tissue healing occurs, you may notice vaginal bleeding for 4-6 weeks after the procedure. You may also experience: ? Cramps. ? Thin, watery vaginal discharge that is light pink or brown in color. ? A need to urinate more frequently than usual. ? Nausea.  Do  not drive for 24 hours if you were given a sedative.  Do not have sex or insert anything into your vagina until your health care provider approves. Summary  Endometrial ablation is done to treat the many causes of heavy menstrual bleeding.  The procedure may be done only after medications have been tried to control the bleeding.  Plan to have someone take you home from the hospital or clinic. This information is not intended to replace advice given to you by your health care provider. Make sure you discuss any questions you have with your health care provider. Document Released: 12/12/2003 Document Revised: 02/19/2016 Document Reviewed: 02/19/2016 Elsevier Interactive Patient Education  2017 [place] Anesthesia, Adult General anesthesia is the use of medicines to make a person "go to sleep" (be unconscious) for a medical procedure. General anesthesia is often recommended when a procedure:  Is long.  Requires you to be still or in an unusual position.  Is major and can cause you to lose blood.  Is impossible to do without general anesthesia.  The medicines used for general anesthesia are called general anesthetics. In addition to making you sleep, the medicines:  Prevent pain.  Control your blood pressure.  Relax your muscles.  Tell a health care provider about:  Any allergies you have.  All medicines you are taking, including vitamins, herbs, eye drops, creams, and over-the-counter medicines.  Any problems you or family members have had with anesthetic medicines.  Types of anesthetics you have had in the past.  Any bleeding disorders you have.  Any surgeries you have had.  Any medical conditions you have.  Any history of heart or lung conditions, such as heart failure, sleep apnea, or chronic obstructive pulmonary disease (COPD).  Whether you are pregnant or may be pregnant.  Whether you use tobacco, alcohol, marijuana, or street drugs.  Any  history of Armed forces logistics/support/administrative officer.  Any history of depression or anxiety. What are the risks? Generally, this is a safe procedure. However, problems may occur, including:  Allergic reaction to anesthetics.  Lung and heart problems.  Inhaling food or liquids from your stomach into your lungs (aspiration).  Injury to nerves.  Waking up during your procedure and being unable to move (rare).  Extreme agitation  or a state of mental confusion (delirium) when you wake up from the anesthetic.  Air in the bloodstream, which can lead to stroke.  These problems are more likely to develop if you are having a major surgery or if you have an advanced medical condition. You can prevent some of these complications by answering all of your health care provider's questions thoroughly and by following all pre-procedure instructions. General anesthesia can cause side effects, including:  Nausea or vomiting  A sore throat from the breathing tube.  Feeling cold or shivery.  Feeling tired, washed out, or achy.  Sleepiness or drowsiness.  Confusion or agitation.  What happens before the procedure? Staying hydrated Follow instructions from your health care provider about hydration, which may include:  Up to 2 hours before the procedure - you may continue to drink clear liquids, such as water, clear fruit juice, black coffee, and plain tea.  Eating and drinking restrictions Follow instructions from your health care provider about eating and drinking, which may include:  8 hours before the procedure - stop eating heavy meals or foods such as meat, fried foods, or fatty foods.  6 hours before the procedure - stop eating light meals or foods, such as toast or cereal.  6 hours before the procedure - stop drinking milk or drinks that contain milk.  2 hours before the procedure - stop drinking clear liquids.  Medicines  Ask your health care provider about: ? Changing or stopping your regular  medicines. This is especially important if you are taking diabetes medicines or blood thinners. ? Taking medicines such as aspirin and ibuprofen. These medicines can thin your blood. Do not take these medicines before your procedure if your health care provider instructs you not to. ? Taking new dietary supplements or medicines. Do not take these during the week before your procedure unless your health care provider approves them.  If you are told to take a medicine or to continue taking a medicine on the day of the procedure, take the medicine with sips of water. General instructions   Ask if you will be going home the same day, the following day, or after a longer hospital stay. ? Plan to have someone take you home. ? Plan to have someone stay with you for the first 24 hours after you leave the hospital or clinic.  For 3-6 weeks before the procedure, try not to use any tobacco products, such as cigarettes, chewing tobacco, and e-cigarettes.  You may brush your teeth on the morning of the procedure, but make sure to spit out the toothpaste. What happens during the procedure?  You will be given anesthetics through a mask and through an IV tube in one of your veins.  You may receive medicine to help you relax (sedative).  As soon as you are asleep, a breathing tube may be used to help you breathe.  An anesthesia specialist will stay with you throughout the procedure. He or she will help keep you comfortable and safe by continuing to give you medicines and adjusting the amount of medicine that you get. He or she will also watch your blood pressure, pulse, and oxygen levels to make sure that the anesthetics do not cause any problems.  If a breathing tube was used to help you breathe, it will be removed before you wake up. The procedure may vary among health care providers and hospitals. What happens after the procedure?  You will wake up, often slowly, after the procedure  is complete,  usually in a recovery area.  Your blood pressure, heart rate, breathing rate, and blood oxygen level will be monitored until the medicines you were given have worn off.  You may be given medicine to help you calm down if you feel anxious or agitated.  If you will be going home the same day, your health care provider may check to make sure you can stand, drink, and urinate.  Your health care providers will treat your pain and side effects before you go home.  Do not drive for 24 hours if you received a sedative.  You may: ? Feel nauseous and vomit. ? Have a sore throat. ? Have mental slowness. ? Feel cold or shivery. ? Feel sleepy. ? Feel tired. ? Feel sore or achy, even in parts of your body where you did not have surgery. This information is not intended to replace advice given to you by your health care provider. Make sure you discuss any questions you have with your health care provider. Document Released: 05/11/2007 Document Revised: 07/15/2015 Document Reviewed: 01/16/2015 Elsevier Interactive Patient Education  2018 [place] Anesthesia, Adult, Care After These instructions provide you with information about caring for yourself after your procedure. Your health care provider may also give you more specific instructions. Your treatment has been planned according to current medical practices, but problems sometimes occur. Call your health care provider if you have any problems or questions after your procedure. What can I expect after the procedure? After the procedure, it is common to have:  Vomiting.  A sore throat.  Mental slowness.  It is common to feel:  Nauseous.  Cold or shivery.  Sleepy.  Tired.  Sore or achy, even in parts of your body where you did not have surgery.  Follow these instructions at home: For at least 24 hours after the procedure:  Do not: ? Participate in activities where you could fall or become injured. ? Drive. ? Use  heavy machinery. ? Drink alcohol. ? Take sleeping pills or medicines that cause drowsiness. ? Make important decisions or sign legal documents. ? Take care of children on your own.  Rest. Eating and drinking  If you vomit, drink water, juice, or soup when you can drink without vomiting.  Drink enough fluid to keep your urine clear or pale yellow.  Make sure you have little or no nausea before eating solid foods.  Follow the diet recommended by your health care provider. General instructions  Have a responsible adult stay with you until you are awake and alert.  Return to your normal activities as told by your health care provider. Ask your health care provider what activities are safe for you.  Take over-the-counter and prescription medicines only as told by your health care provider.  If you smoke, do not smoke without supervision.  Keep all follow-up visits as told by your health care provider. This is important. Contact a health care provider if:  You continue to have nausea or vomiting at home, and medicines are not helpful.  You cannot drink fluids or start eating again.  You cannot urinate after 8-12 hours.  You develop a skin rash.  You have fever.  You have increasing redness at the site of your procedure. Get help right away if:  You have difficulty breathing.  You have chest pain.  You have unexpected bleeding.  You feel that you are having a life-threatening or urgent problem. This information is not intended to replace  advice given to you by your health care provider. Make sure you discuss any questions you have with your health care provider. Document Released: 05/10/2000 Document Revised: 07/07/2015 Document Reviewed: 01/16/2015 Elsevier Interactive Patient Education  [employer].

## 2017-05-18 ENCOUNTER — Encounter (HOSPITAL_COMMUNITY): Payer: Self-pay

## 2017-05-18 ENCOUNTER — Ambulatory Visit (HOSPITAL_COMMUNITY)
Admission: RE | Admit: 2017-05-18 | Discharge: 2017-05-18 | Disposition: A | Discharge status: Home / Self Care | Payer: Medicaid Other | Source: Ambulatory Visit | Attending: Obstetrics & Gynecology | Admitting: Obstetrics & Gynecology

## 2017-05-18 ENCOUNTER — Encounter (HOSPITAL_COMMUNITY)
Admission: RE | Disposition: A | Discharge status: Home / Self Care | Payer: Self-pay | Source: Ambulatory Visit | Attending: Obstetrics & Gynecology

## 2017-05-18 ENCOUNTER — Ambulatory Visit (HOSPITAL_COMMUNITY): Payer: Medicaid Other | Admitting: Certified Registered"

## 2017-05-18 DIAGNOSIS — J449 Chronic obstructive pulmonary disease, unspecified: Secondary | ICD-10-CM | POA: Diagnosis not present

## 2017-05-18 DIAGNOSIS — N920 Excessive and frequent menstruation with regular cycle: Secondary | ICD-10-CM | POA: Insufficient documentation

## 2017-05-18 DIAGNOSIS — E039 Hypothyroidism, unspecified: Secondary | ICD-10-CM | POA: Diagnosis not present

## 2017-05-18 DIAGNOSIS — F319 Bipolar disorder, unspecified: Secondary | ICD-10-CM | POA: Diagnosis not present

## 2017-05-18 DIAGNOSIS — F17210 Nicotine dependence, cigarettes, uncomplicated: Secondary | ICD-10-CM | POA: Insufficient documentation

## 2017-05-18 DIAGNOSIS — Z885 Allergy status to narcotic agent status: Secondary | ICD-10-CM | POA: Diagnosis not present

## 2017-05-18 DIAGNOSIS — F419 Anxiety disorder, unspecified: Secondary | ICD-10-CM | POA: Insufficient documentation

## 2017-05-18 DIAGNOSIS — N946 Dysmenorrhea, unspecified: Secondary | ICD-10-CM

## 2017-05-18 DIAGNOSIS — Z302 Encounter for sterilization: Secondary | ICD-10-CM | POA: Insufficient documentation

## 2017-05-18 HISTORY — PX: ENDOMETRIAL ABLATION: SHX621

## 2017-05-18 HISTORY — PX: LAPAROSCOPIC TUBAL LIGATION: SHX1937

## 2017-05-18 HISTORY — PX: HYSTEROSCOPY WITH D & C: SHX1775

## 2017-05-18 SURGERY — DILATATION AND CURETTAGE /HYSTEROSCOPY
Anesthesia: General | Site: Vagina

## 2017-05-18 MED ORDER — ONDANSETRON HCL 4 MG/2ML IJ SOLN: 4.0000 mg | Freq: Once | INTRAMUSCULAR | Status: DC | PRN

## 2017-05-18 MED ORDER — ROCURONIUM BROMIDE 100 MG/10ML IV SOLN
INTRAVENOUS | Status: DC | PRN
  Administered 2017-05-18: 5 mg via INTRAVENOUS
  Administered 2017-05-18: 25 mg via INTRAVENOUS

## 2017-05-18 MED ORDER — HYDROMORPHONE HCL 1 MG/ML IJ SOLN
0.2500 mg | INTRAMUSCULAR | Status: DC | PRN
  Administered 2017-05-18: 0.5 mg via INTRAVENOUS
  Filled 2017-05-18: qty 0.5

## 2017-05-18 MED ORDER — BUPIVACAINE LIPOSOME 1.3 % IJ SUSP
INTRAMUSCULAR | Status: DC | PRN
  Administered 2017-05-18: 20 mL

## 2017-05-18 MED ORDER — DEXAMETHASONE SODIUM PHOSPHATE 10 MG/ML IJ SOLN
INTRAMUSCULAR | Status: DC | PRN
  Administered 2017-05-18: 10 mg via INTRAVENOUS

## 2017-05-18 MED ORDER — ONDANSETRON HCL 4 MG/2ML IJ SOLN
INTRAMUSCULAR | Status: DC | PRN
  Administered 2017-05-18: 4 mg via INTRAVENOUS

## 2017-05-18 MED ORDER — KETOROLAC TROMETHAMINE 10 MG PO TABS: 10.0000 mg | ORAL_TABLET | Freq: Three times a day (TID) | ORAL | 0 refills | Status: DC | PRN

## 2017-05-18 MED ORDER — PROPOFOL 10 MG/ML IV BOLUS
INTRAVENOUS | Status: DC | PRN
  Administered 2017-05-18: 200 mg via INTRAVENOUS

## 2017-05-18 MED ORDER — KETOROLAC TROMETHAMINE 30 MG/ML IJ SOLN
30.0000 mg | Freq: Once | INTRAMUSCULAR | Status: AC
  Administered 2017-05-18: 30 mg via INTRAVENOUS
  Filled 2017-05-18: qty 1

## 2017-05-18 MED ORDER — SUGAMMADEX SODIUM 200 MG/2ML IV SOLN
INTRAVENOUS | Status: DC | PRN
  Administered 2017-05-18: 200 mg via INTRAVENOUS

## 2017-05-18 MED ORDER — FENTANYL CITRATE (PF) 100 MCG/2ML IJ SOLN
INTRAMUSCULAR | Status: AC
  Filled 2017-05-18: qty 2

## 2017-05-18 MED ORDER — MIDAZOLAM HCL 2 MG/2ML IJ SOLN
0.5000 mg | INTRAMUSCULAR | Status: DC | PRN
Start: 2017-05-18 — End: 2017-05-18

## 2017-05-18 MED ORDER — SODIUM CHLORIDE 0.9 % IR SOLN
Status: DC | PRN
  Administered 2017-05-18: 1000 mL

## 2017-05-18 MED ORDER — LIDOCAINE HCL (CARDIAC) 20 MG/ML IV SOLN
INTRAVENOUS | Status: DC | PRN
  Administered 2017-05-18: 100 mg via INTRATRACHEAL

## 2017-05-18 MED ORDER — HYDROCODONE-ACETAMINOPHEN 5-325 MG PO TABS: 1.0000 | ORAL_TABLET | Freq: Four times a day (QID) | ORAL | 0 refills | Status: DC | PRN

## 2017-05-18 MED ORDER — SUCCINYLCHOLINE CHLORIDE 20 MG/ML IJ SOLN
INTRAMUSCULAR | Status: DC | PRN
  Administered 2017-05-18: 120 mg via INTRAVENOUS

## 2017-05-18 MED ORDER — IPRATROPIUM-ALBUTEROL 0.5-2.5 (3) MG/3ML IN SOLN
3.0000 mL | Freq: Once | RESPIRATORY_TRACT | Status: AC
  Administered 2017-05-18: 3 mL via RESPIRATORY_TRACT

## 2017-05-18 MED ORDER — ONDANSETRON 8 MG PO TBDP: 8.0000 mg | ORAL_TABLET | Freq: Three times a day (TID) | ORAL | 0 refills | Status: DC | PRN

## 2017-05-18 MED ORDER — IPRATROPIUM-ALBUTEROL 0.5-2.5 (3) MG/3ML IN SOLN: 3.0000 mL | Freq: Four times a day (QID) | RESPIRATORY_TRACT | Status: DC

## 2017-05-18 MED ORDER — LACTATED RINGERS IV SOLN
INTRAVENOUS | Status: DC
  Administered 2017-05-18 (×2): via INTRAVENOUS

## 2017-05-18 MED ORDER — IPRATROPIUM-ALBUTEROL 0.5-2.5 (3) MG/3ML IN SOLN
RESPIRATORY_TRACT | Status: AC
  Filled 2017-05-18: qty 3

## 2017-05-18 MED ORDER — BUPIVACAINE LIPOSOME 1.3 % IJ SUSP
INTRAMUSCULAR | Status: AC
  Filled 2017-05-18: qty 20

## 2017-05-18 MED ORDER — PROPOFOL 10 MG/ML IV BOLUS
INTRAVENOUS | Status: AC
  Filled 2017-05-18: qty 40

## 2017-05-18 MED ORDER — FENTANYL CITRATE (PF) 100 MCG/2ML IJ SOLN
INTRAMUSCULAR | Status: DC | PRN
  Administered 2017-05-18: 25 ug via INTRAVENOUS
  Administered 2017-05-18 (×2): 50 ug via INTRAVENOUS
  Administered 2017-05-18: 25 ug via INTRAVENOUS

## 2017-05-18 MED ORDER — METOPROLOL TARTRATE 5 MG/5ML IV SOLN
INTRAVENOUS | Status: DC | PRN
Start: 2017-05-18 — End: 2017-05-18
  Administered 2017-05-18: 2 mg via INTRAVENOUS

## 2017-05-18 MED ORDER — BUPIVACAINE LIPOSOME 1.3 % IJ SUSP: 20.0000 mL | Freq: Once | INTRAMUSCULAR | Status: DC

## 2017-05-18 MED ORDER — CEFAZOLIN SODIUM-DEXTROSE 2-4 GM/100ML-% IV SOLN
2.0000 g | INTRAVENOUS | Status: AC
  Administered 2017-05-18: 2 g via INTRAVENOUS
  Filled 2017-05-18: qty 100

## 2017-05-18 SURGICAL SUPPLY — 45 items
BAG HAMPER (MISCELLANEOUS) ×4 IMPLANT
BLADE SURG SZ11 CARB STEEL (BLADE) ×4 IMPLANT
CATH ROBINSON RED A/P 16FR (CATHETERS) ×4 IMPLANT
CLOTH BEACON ORANGE TIMEOUT ST (SAFETY) ×4 IMPLANT
COVER LIGHT HANDLE STERIS (MISCELLANEOUS) ×8 IMPLANT
ELECT REM PT RETURN 9FT ADLT (ELECTROSURGICAL) ×4
ELECTRODE REM PT RTRN 9FT ADLT (ELECTROSURGICAL) ×2 IMPLANT
GAUZE SPONGE 4X4 12PLY STRL (GAUZE/BANDAGES/DRESSINGS) ×4 IMPLANT
GAUZE SPONGE 4X4 16PLY XRAY LF (GAUZE/BANDAGES/DRESSINGS) ×4 IMPLANT
GLOVE BIOGEL PI IND STRL 7.0 (GLOVE) ×2 IMPLANT
GLOVE BIOGEL PI IND STRL 8 (GLOVE) ×2 IMPLANT
GLOVE BIOGEL PI INDICATOR 7.0 (GLOVE) ×2
GLOVE BIOGEL PI INDICATOR 8 (GLOVE) ×2
GLOVE ECLIPSE 8.0 STRL XLNG CF (GLOVE) ×4 IMPLANT
GOWN STRL REUS W/TWL LRG LVL3 (GOWN DISPOSABLE) ×4 IMPLANT
GOWN STRL REUS W/TWL XL LVL3 (GOWN DISPOSABLE) ×4 IMPLANT
INST SET HYSTEROSCOPY (KITS) ×4 IMPLANT
INST SET LAPROSCOPIC GYN AP (KITS) ×4 IMPLANT
IV NS 1000ML (IV SOLUTION) ×2
IV NS 1000ML BAXH (IV SOLUTION) ×2 IMPLANT
KIT ROOM TURNOVER AP CYSTO (KITS) ×4 IMPLANT
MANIFOLD NEPTUNE II (INSTRUMENTS) ×4 IMPLANT
MARKER SKIN DUAL TIP RULER LAB (MISCELLANEOUS) ×4 IMPLANT
NEEDLE INSUFFLATION 14GA 120MM (NEEDLE) ×4 IMPLANT
NS IRRIG 1000ML POUR BTL (IV SOLUTION) ×4 IMPLANT
PACK BASIC III (CUSTOM PROCEDURE TRAY) ×2
PACK PERI GYN (CUSTOM PROCEDURE TRAY) ×4 IMPLANT
PACK SRG BSC III STRL LF ECLPS (CUSTOM PROCEDURE TRAY) ×2 IMPLANT
PAD ARMBOARD 7.5X6 YLW CONV (MISCELLANEOUS) ×4 IMPLANT
PAD TELFA 3X4 1S STER (GAUZE/BANDAGES/DRESSINGS) ×4 IMPLANT
SET BASIN LINEN APH (SET/KITS/TRAYS/PACK) ×4 IMPLANT
SET IRRIG Y TYPE TUR BLADDER L (SET/KITS/TRAYS/PACK) ×4 IMPLANT
SHEET LAVH (DRAPES) ×4 IMPLANT
SOLUTION ANTI FOG 6CC (MISCELLANEOUS) ×4 IMPLANT
SPONGE GAUZE 2X2 8PLY STER LF (GAUZE/BANDAGES/DRESSINGS) ×1
SPONGE GAUZE 2X2 8PLY STRL LF (GAUZE/BANDAGES/DRESSINGS) ×3 IMPLANT
STAPLER VISISTAT 35W (STAPLE) ×4 IMPLANT
SUT VICRYL 0 UR6 27IN ABS (SUTURE) ×4 IMPLANT
SYRINGE 10CC LL (SYRINGE) IMPLANT
TAPE PAPER 3X10 WHT MICROPORE (GAUZE/BANDAGES/DRESSINGS) ×4 IMPLANT
TROCAR ENDO BLADELESS 11MM (ENDOMECHANICALS) ×4 IMPLANT
TROCAR XCEL NON-BLD 5MMX100MML (ENDOMECHANICALS) ×4 IMPLANT
TUBING INSUF HEATED (TUBING) ×4 IMPLANT
WARMER LAPAROSCOPE (MISCELLANEOUS) ×4 IMPLANT
YANKAUER SUCT BULB TIP 10FT TU (MISCELLANEOUS) ×4 IMPLANT

## 2017-05-18 NOTE — Discharge Instructions (Signed)
Laparoscopic Tubal Ligation, Care After Refer to this sheet in the next few weeks. These instructions provide you with information about caring for yourself after your procedure. Your health care provider may also give you more specific instructions. Your treatment has been planned according to current medical practices, but problems sometimes occur. Call your health care provider if you have any problems or questions after your procedure. What can I expect after the procedure? After the procedure, it is common to have:  A sore throat.  Discomfort in your shoulder.  Mild discomfort or cramping in your abdomen.  Gas pains.  Pain or soreness in the area where the surgical cut (incision) was made.  A bloated feeling.  Tiredness.  Nausea.  Vomiting.  Follow these instructions at home: Medicines  Take over-the-counter and prescription medicines only as told by your health care provider.  Do not take aspirin because it can cause bleeding.  Do not drive or operate heavy machinery while taking prescription pain medicine. Activity  Rest for the rest of the day.  Return to your normal activities as told by your health care provider. Ask your health care provider what activities are safe for you. Incision care   Follow instructions from your health care provider about how to take care of your incision. Make sure you: ? Wash your hands with soap and water before you change your bandage (dressing). If soap and water are not available, use hand sanitizer. ? Change your dressing as told by your health care provider. ? Leave stitches (sutures) in place. They may need to stay in place for 2 weeks or longer.  Check your incision area every day for signs of infection. Check for: ? More redness, swelling, or pain. ? More fluid or blood. ? Warmth. ? Pus or a bad smell. Other Instructions  Do not take baths, swim, or use a hot tub until your health care provider approves. You may take  showers.  Keep all follow-up visits as told by your health care provider. This is important.  Have someone help you with your daily household tasks for the first few days. Contact a health care provider if:  You have more redness, swelling, or pain around your incision.  Your incision feels warm to the touch.  You have pus or a bad smell coming from your incision.  The edges of your incision break open after the sutures have been removed.  Your pain does not improve after 2-3 days.  You have a rash.  You repeatedly become dizzy or light-headed.  Your pain medicine is not helping.  You are constipated. Get help right away if:  You have a fever.  You faint.  You have increasing pain in your abdomen.  You have severe pain in one or both of your shoulders.  You have fluid or blood coming from your sutures or from your vagina.  You have shortness of breath or difficulty breathing.  You have chest pain or leg pain.  You have ongoing nausea, vomiting, or diarrhea. This information is not intended to replace advice given to you by your health care provider. Make sure you discuss any questions you have with your health care provider. Document Released: 08/21/2004 Document Revised: 07/07/2015 Document Reviewed: 01/12/2015 Elsevier Interactive Patient Education  2018 [place]. Endometrial Ablation Endometrial ablation is a procedure that destroys the thin inner layer of the lining of the uterus (endometrium). This procedure may be done:  To stop heavy periods.  To stop bleeding that  is causing anemia.  To control irregular bleeding.  To treat bleeding caused by small tumors (fibroids) in the endometrium.  This procedure is often an alternative to major surgery, such as removal of the uterus and cervix (hysterectomy). As a result of this procedure:  You may not be able to have children. However, if you are premenopausal (you have not gone through menopause): ? You  may still have a small chance of getting pregnant. ? You will need to use a reliable method of birth control after the procedure to prevent pregnancy.  You may stop having a menstrual period, or you may have only a small amount of bleeding during your period. Menstruation may return several years after the procedure.  Tell a health care provider about:  Any allergies you have.  All medicines you are taking, including vitamins, herbs, eye drops, creams, and over-the-counter medicines.  Any problems you or family members have had with the use of anesthetic medicines.  Any blood disorders you have.  Any surgeries you have had.  Any medical conditions you have. What are the risks? Generally, this is a safe procedure. However, problems may occur, including:  A hole (perforation) in the uterus or bowel.  Infection of the uterus, bladder, or vagina.  Bleeding.  Damage to other structures or organs.  An air bubble in the lung (air embolus).  Problems with pregnancy after the procedure.  Failure of the procedure.  Decreased ability to diagnose cancer in the endometrium.  What happens before the procedure?  You will have tests of your endometrium to make sure there are no pre-cancerous cells or cancer cells present.  You may have an ultrasound of the uterus.  You may be given medicines to thin the endometrium.  Ask your health care provider about: ? Changing or stopping your regular medicines. This is especially important if you take diabetes medicines or blood thinners. ? Taking medicines such as aspirin and ibuprofen. These medicines can thin your blood. Do not take these medicines before your procedure if your doctor tells you not to.  Plan to have someone take you home from the hospital or clinic. What happens during the procedure?  You will lie on an exam table with your feet and legs supported as in a pelvic exam.  To lower your risk of infection: ? Your health care  team will wash or sanitize their hands and put on germ-free (sterile) gloves. ? Your genital area will be washed with soap.  An IV tube will be inserted into one of your veins.  You will be given a medicine to help you relax (sedative).  A surgical instrument with a light and camera (resectoscope) will be inserted into your vagina and moved into your uterus. This allows your surgeon to see inside your uterus.  Endometrial tissue will be removed using one of the following methods: ? Radiofrequency. This method uses a radiofrequency-alternating electric current to remove the endometrium. ? Cryotherapy. This method uses extreme cold to freeze the endometrium. ? Heated-free liquid. This method uses a heated saltwater (saline) solution to remove the endometrium. ? Microwave. This method uses high-energy microwaves to heat up the endometrium and remove it. ? Thermal balloon. This method involves inserting a catheter with a balloon tip into the uterus. The balloon tip is filled with heated fluid to remove the endometrium. The procedure may vary among health care providers and hospitals. What happens after the procedure?  Your blood pressure, heart rate, breathing rate, and blood oxygen  level will be monitored until the medicines you were given have worn off.  As tissue healing occurs, you may notice vaginal bleeding for 4-6 weeks after the procedure. You may also experience: ? Cramps. ? Thin, watery vaginal discharge that is light pink or brown in color. ? A need to urinate more frequently than usual. ? Nausea.  Do not drive for 24 hours if you were given a sedative.  Do not have sex or insert anything into your vagina until your health care provider approves. Summary  Endometrial ablation is done to treat the many causes of heavy menstrual bleeding.  The procedure may be done only after medications have been tried to control the bleeding.  Plan to have someone take you home from the  hospital or clinic. This information is not intended to replace advice given to you by your health care provider. Make sure you discuss any questions you have with your health care provider. Document Released: 12/12/2003 Document Revised: 02/19/2016 Document Reviewed: 02/19/2016 Elsevier Interactive Patient Education  2017 [place] Anesthesia, Adult, Care After These instructions provide you with information about caring for yourself after your procedure. Your health care provider may also give you more specific instructions. Your treatment has been planned according to current medical practices, but problems sometimes occur. Call your health care provider if you have any problems or questions after your procedure. What can I expect after the procedure? After the procedure, it is common to have:  Vomiting.  A sore throat.  Mental slowness.  It is common to feel:  Nauseous.  Cold or shivery.  Sleepy.  Tired.  Sore or achy, even in parts of your body where you did not have surgery.  Follow these instructions at home: For at least 24 hours after the procedure:  Do not: ? Participate in activities where you could fall or become injured. ? Drive. ? Use heavy machinery. ? Drink alcohol. ? Take sleeping pills or medicines that cause drowsiness. ? Make important decisions or sign legal documents. ? Take care of children on your own.  Rest. Eating and drinking  If you vomit, drink water, juice, or soup when you can drink without vomiting.  Drink enough fluid to keep your urine clear or pale yellow.  Make sure you have little or no nausea before eating solid foods.  Follow the diet recommended by your health care provider. General instructions  Have a responsible adult stay with you until you are awake and alert.  Return to your normal activities as told by your health care provider. Ask your health care provider what activities are safe for  you.  Take over-the-counter and prescription medicines only as told by your health care provider.  If you smoke, do not smoke without supervision.  Keep all follow-up visits as told by your health care provider. This is important. Contact a health care provider if:  You continue to have nausea or vomiting at home, and medicines are not helpful.  You cannot drink fluids or start eating again.  You cannot urinate after 8-12 hours.  You develop a skin rash.  You have fever.  You have increasing redness at the site of your procedure. Get help right away if:  You have difficulty breathing.  You have chest pain.  You have unexpected bleeding.  You feel that you are having a life-threatening or urgent problem. This information is not intended to replace advice given to you by your health care provider.  Make sure you discuss any questions you have with your health care provider. Document Released: 05/10/2000 Document Revised: 07/07/2015 Document Reviewed: 01/16/2015 Elsevier Interactive Patient Education  [employer].

## 2017-05-18 NOTE — Anesthesia Postprocedure Evaluation (Signed)
Anesthesia Post Note  Patient: Dawn Carter  Procedure(s) Performed: DILATATION AND CURETTAGE /HYSTEROSCOPY (N/A Vagina ) ENDOMETRIAL ABLATION MINERVA (N/A Vagina ) LAPAROSCOPIC BILATERAL TUBAL LIGATION (Bilateral Abdomen)  Patient location during evaluation: Short Stay Anesthesia Type: General Level of consciousness: awake and alert and patient cooperative Pain management: satisfactory to patient Vital Signs Assessment: post-procedure vital signs reviewed and stable Respiratory status: spontaneous breathing Cardiovascular status: stable Postop Assessment: no apparent nausea or vomiting Anesthetic complications: no     Last Vitals:  Vitals:   05/18/17 1403 05/18/17 1415  BP: 106/77 119/87  Pulse: 86 84  Resp: 18 16  Temp: 36.9 C   SpO2: 97% 96%    Last Pain:  Vitals:   05/18/17 1415  TempSrc:   PainSc: 5                  Avary Eichenberger

## 2017-05-18 NOTE — Progress Notes (Signed)
[facility] notified patient and friend Mr. Ronnald Ramp ready to leave hospital.

## 2017-05-18 NOTE — Op Note (Signed)
2 operative reports:  Preoperative Diagnosis:  Multiparous female desires permanent sterilization  Postoperative Diagnosis:  Same as above  Procedure:  Laparoscopic Bilateral Tubal Ligation using electrocautery  Surgeon:  Jacelyn Grip MD  Anaesthesia:  LMA  Findings:  Patient had normal pelvic anatomy and no intraperitoneal abnormalities.  Description of Operation:  Patient was taken to the OR and placed into supine position where she underwent LMA anaesthesia.  She was placed in the dorsal lithotomy position and prepped and draped in the usual sterile fashion.  An incision was made in the umbilicus and dissection taken down to the rectus fascia which was incised and opened.  The non bladed trocar was then placed and the peritoneal cavity was insufflated.  The above noted findings were observed. We could not find the proper adapter for the light cord and so the diagnostic scope was used.  As a result, a second trocar, 5 mm, was placed in the LLQ for the Kleppinger. The Kleppinger electrocautery unit was employed and both tubes were burned to no resistance and beyond in the distal ishtmic and ampullary regions, approximately a 3.5 cm segment bilaterally.  There was good hemostasis.  The fascia, peritoneum and subcutaneous tissue were closed using 0 vicryl.  The skin incisions were both closed using staples.  The patient was awakened from anaesthesia and taken to the PACU with all counts being correct x 3.  The patient received Ancef 2 gram and Toradol 30 mg IV preoperatively. 20 cc of exparel was injected into the incision sites  Florian Buff, MD 05/18/2017 1:18 PM  Preoperative diagnosis:  1.   menorrhagia                                         2.  dysmenorrhea   Postoperative diagnoses: Same as above   Procedure: Hysteroscopy, uterine curettage, endometrial ablation using Minerva  Surgeon: Florian Buff   Anesthesia: Laryngeal mask airway  Findings: The endometrium was normal.  There were no fibroid or other abnormalities.  Description of operation: The patient was taken to the operating room and placed in the supine position. She underwent general anesthesia using the laryngeal mask airway. She was placed in the dorsal lithotomy position and prepped and draped in the usual sterile fashion. A Graves speculum was placed and the anterior cervical lip was grasped with a single-tooth tenaculum. The cervix was dilated serially to allow passage of the hysteroscope. Diagnostic hysteroscopy was performed and was found to be normal. A vigorous uterine curettage was then performed and all tissue sent to pathology for evaluation.  I then proceeded to perform the Minerva endometrial ablation.   The uterus sounded to 8.5 cm The handpiece was attached to the Minerva power source/machine and the handpiece passed the checklist. The array was squeezed down to remove all of the air present.  The array was then place into the endometrial cavity and deployed to a length of 5.5 cm. The handpiece confirmed appropriate width by being in the green portion of the visual dial. The cervical cuff was then inflated to the point the CO2 indicator was in the green. The endometrial integrity check was then performed and integrity sequence was confirmed x 2. The heating was then begun and carried out for a total of 2 minutes(which is standard therapy time). When the plasma cycle was finished,  the cervical cuff was  deflated and the array was removed with tissue present on the silicon membrane. There was appropriate post Minerva bleeding and uterine discharge.     All of the equipment worked well throughout the procedure.  The patient was awakened from anesthesia and taken to the recovery room in good stable condition all counts were correct. She received 2 g of Ancef and 30 mg of Toradol preoperatively. She will be discharged from the recovery room and followed up in the office in 1- 2 weeks.   She can  expect 4 weeks of post procedure bloody watery discharge   Florian Buff, MD  05/18/2017 1:18 PM

## 2017-05-18 NOTE — Anesthesia Preprocedure Evaluation (Signed)
Anesthesia Evaluation  Patient identified by MRN, date of birth, ID band Patient awake    Reviewed: Allergy & Precautions, NPO status , Patient's Chart, lab work & pertinent test results  Airway Mallampati: II  TM Distance: >3 FB Neck ROM: Full    Dental no notable dental hx. (+) Missing, Poor Dentition, Loose, Dental Advisory Given   Pulmonary asthma , COPD, Current Smoker,     + wheezing      Cardiovascular Normal cardiovascular exam Rhythm:Regular Rate:Normal     Neuro/Psych Seizures -,  Anxiety Depression Bipolar Disorder    GI/Hepatic   Endo/Other  Hypothyroidism   Renal/GU      Musculoskeletal  (+) Arthritis ,   Abdominal   Peds  Hematology   Anesthesia Other Findings   Reproductive/Obstetrics                             Anesthesia Physical Anesthesia Plan  ASA: III  Anesthesia Plan: General   Post-op Pain Management:    Induction: Intravenous  PONV Risk Score and Plan:   Airway Management Planned:   Additional Equipment:   Intra-op Plan:   Post-operative Plan: Extubation in OR  Informed Consent: I have reviewed the patients History and Physical, chart, labs and discussed the procedure including the risks, benefits and alternatives for the proposed anesthesia with the patient or authorized representative who has indicated his/her understanding and acceptance.   Dental advisory given  Plan Discussed with: CRNA  Anesthesia Plan Comments:         Anesthesia Quick Evaluation

## 2017-05-18 NOTE — H&P (Signed)
Preoperative History and Physical  Dawn Carter is a 47 y.o. Carter with Patient's last menstrual period was 05/18/2017. admitted for a laparoscopic bilateral tubal ligation and hysteroscopy uterine curettage Minerva endometrial ablation.    Note from Derrek Monaco: 03/24/2017: Dawn Carter is a 47 year old black female, G7P6, single, in for well woman gyn exam and pap.She is complaining of heavy periods 5 out of 7 days with cramps for about 2 years.Changes pads every 2 hours or so. She is new pt.  She has been managed on megestrol in the meantime and sonogram is normal    PMH:    Past Medical History:  Diagnosis Date  . Anxiety disorder   . Arthritis   . Asthma   . Back pain   . Bipolar 1 disorder ([place])   . COPD (chronic obstructive pulmonary disease) ([place])   . Depression   . Fibroid 04/04/2017  . Hypothyroidism   . Seizures ([place])    1 as child; unknown etiology and on no meds. No more seizures since then.  . Thyroid disease    hypothyroid    PSH:     Past Surgical History:  Procedure Laterality Date  . CERVICAL CONE BIOPSY      POb/GynH:      OB History    Gravida  7   Para  6   Term      Preterm      AB  1   Living  6     SAB      TAB  1   Ectopic      Multiple      Live Births              SH:   Social History   Tobacco Use  . Smoking status: Current Every Day Smoker    Packs/day: 0.25    Years: 20.00    Pack years: 5.00    Types: Cigarettes  . Smokeless tobacco: Never Used  Substance Use Topics  . Alcohol use: Not Currently    Frequency: Never  . Drug use: Yes    Types: Marijuana    Comment: last used 1 week ago.    FH:    Family History  Problem Relation Age of Onset  . Hypertension Paternal Grandfather   . Hypertension Paternal Grandmother   . Diabetes Maternal Grandmother   . Heart attack Maternal Grandmother   . Hypertension Maternal Grandmother   . Hypertension Maternal Grandfather   . Cancer Father        prostate   . Diabetes Mother   . Hypertension Mother   . Heart attack Mother   . Hypertension Brother   . Kidney disease Brother   . Hypertension Sister      Allergies:  Allergies  Allergen Reactions  . Coconut Fatty Acids Anaphylaxis  . Other Hives and Itching    belbucha  . Percocet [Oxycodone-Acetaminophen] Itching    Can take if premedicated with benadryl  . Chantix [Varenicline] Hives, Itching and Rash    Medications:       Current Facility-Administered Medications:  .  bupivacaine liposome (EXPAREL) 1.3 % injection 266 mg, 20 mL, Infiltration, Once, Johnrobert Foti H, MD .  ceFAZolin (ANCEF) IVPB 2g/100 mL premix, 2 g, Intravenous, On Call to OR, Florian Buff, MD .  lactated ringers infusion, , Intravenous, Continuous, Nicanor Alcon, MD, Last Rate: 50 mL/hr at 05/18/17 [phone] .  midazolam (VERSED) injection 0.5-2 mg, 0.5-2 mg, Intravenous, Q5  min PRN, Nicanor Alcon, MD  Review of Systems:   Review of Systems  Constitutional: Negative for fever, chills, weight loss, malaise/fatigue and diaphoresis.  HENT: Negative for hearing loss, ear pain, nosebleeds, congestion, sore throat, neck pain, tinnitus and ear discharge.   Eyes: Negative for blurred vision, double vision, photophobia, pain, discharge and redness.  Respiratory: Negative for cough, hemoptysis, sputum production, shortness of breath, wheezing and stridor.   Cardiovascular: Negative for chest pain, palpitations, orthopnea, claudication, leg swelling and PND.  Gastrointestinal: Positive for abdominal pain. Negative for heartburn, nausea, vomiting, diarrhea, constipation, blood in stool and melena.  Genitourinary: Negative for dysuria, urgency, frequency, hematuria and flank pain.  Musculoskeletal: Negative for myalgias, back pain, joint pain and falls.  Skin: Negative for itching and rash.  Neurological: Negative for dizziness, tingling, tremors, sensory change, speech change, focal weakness, seizures, loss of consciousness,  weakness and headaches.  Endo/Heme/Allergies: Negative for environmental allergies and polydipsia. Does not bruise/bleed easily.  Psychiatric/Behavioral: Negative for depression, suicidal ideas, hallucinations, memory loss and substance abuse. The patient is not nervous/anxious and does not have insomnia.      PHYSICAL EXAM:  Blood pressure (!) 142/75, pulse 94, temperature 99 F (37.2 C), temperature source Oral, resp. rate 17, last menstrual period 05/18/2017, SpO2 98 %.    Vitals reviewed. Constitutional: She is oriented to person, place, and time. She appears well-developed and well-nourished.  HENT:  Head: Normocephalic and atraumatic.  Right Ear: External ear normal.  Left Ear: External ear normal.  Nose: Nose normal.  Mouth/Throat: Oropharynx is clear and moist.  Eyes: Conjunctivae and EOM are normal. Pupils are equal, round, and reactive to light. Right eye exhibits no discharge. Left eye exhibits no discharge. No scleral icterus.  Neck: Normal range of motion. Neck supple. No tracheal deviation present. No thyromegaly present.  Cardiovascular: Normal rate, regular rhythm, normal heart sounds and intact distal pulses.  Exam reveals no gallop and no friction rub.   No murmur heard. Respiratory: Effort normal and breath sounds normal. No respiratory distress. She has no wheezes. She has no rales. She exhibits no tenderness.  GI: Soft. Bowel sounds are normal. She exhibits no distension and no mass. There is tenderness. There is no rebound and no guarding.  Genitourinary:       Vulva is normal without lesions Vagina is pink moist without discharge Cervix normal in appearance and pap is normal Uterus is normal size, contour, position, consistency, mobility, non-tender Adnexa is negative with normal sized ovaries by sonogram  Musculoskeletal: Normal range of motion. She exhibits no edema and no tenderness.  Neurological: She is alert and oriented to person, place, and time. She  has normal reflexes. She displays normal reflexes. No cranial nerve deficit. She exhibits normal muscle tone. Coordination normal.  Skin: Skin is warm and dry. No rash noted. No erythema. No pallor.  Psychiatric: She has a normal mood and affect. Her behavior is normal. Judgment and thought content normal.    Labs: Results for orders placed or performed during the hospital encounter of 05/13/17 (from the past 336 hour(s))  CBC   Collection Time: 05/13/17  2:00 PM  Result Value Ref Range   WBC 7.4 4.0 - 10.5 K/uL   RBC 4.61 3.87 - 5.11 MIL/uL   Hemoglobin 12.4 12.0 - 15.0 g/dL   HCT 40.2 36.0 - 46.0 %   MCV 87.2 78.0 - 100.0 fL   MCH 26.9 26.0 - 34.0 pg   MCHC 30.8 30.0 - 36.0 g/dL  RDW 15.0 11.5 - 15.5 %   Platelets 256 150 - 400 K/uL  Comprehensive metabolic panel   Collection Time: 05/13/17  2:00 PM  Result Value Ref Range   Sodium 136 135 - 145 mmol/L   Potassium 3.6 3.5 - 5.1 mmol/L   Chloride 104 101 - 111 mmol/L   CO2 21 (L) 22 - 32 mmol/L   Glucose, Bld 116 (H) 65 - 99 mg/dL   BUN 13 6 - 20 mg/dL   Creatinine, Ser 0.82 0.44 - 1.00 mg/dL   Calcium 9.3 8.9 - 10.3 mg/dL   Total Protein 8.0 6.5 - 8.1 g/dL   Albumin 3.9 3.5 - 5.0 g/dL   AST 24 15 - 41 U/L   ALT 19 14 - 54 U/L   Alkaline Phosphatase 66 38 - 126 U/L   Total Bilirubin 0.5 0.3 - 1.2 mg/dL   GFR calc non Af Amer >60 >60 mL/min   GFR calc Af Amer >60 >60 mL/min   Anion gap 11 5 - 15  hCG, quantitative, pregnancy   Collection Time: 05/13/17  2:00 PM  Result Value Ref Range   hCG, Beta Chain, Quant, S <1 <5 mIU/mL  Rapid HIV screen (HIV 1/2 Ab+Ag)   Collection Time: 05/13/17  2:00 PM  Result Value Ref Range   HIV-1 P24 Antigen - HIV24 NON REACTIVE NON REACTIVE   HIV 1/2 Antibodies NON REACTIVE NON REACTIVE   Interpretation (HIV Ag Ab)      A non reactive test result means that HIV 1 or HIV 2 antibodies and HIV 1 p24 antigen were not detected in the specimen.  Urinalysis, Routine w reflex microscopic    Collection Time: 05/13/17  2:00 PM  Result Value Ref Range   Color, Urine YELLOW YELLOW   APPearance HAZY (A) CLEAR   Specific Gravity, Urine 1.014 1.005 - 1.030   pH 6.0 5.0 - 8.0   Glucose, UA NEGATIVE NEGATIVE mg/dL   Hgb urine dipstick NEGATIVE NEGATIVE   Bilirubin Urine NEGATIVE NEGATIVE   Ketones, ur NEGATIVE NEGATIVE mg/dL   Protein, ur NEGATIVE NEGATIVE mg/dL   Nitrite NEGATIVE NEGATIVE   Leukocytes, UA TRACE (A) NEGATIVE   RBC / HPF 0-5 0 - 5 RBC/hpf   WBC, UA 6-30 0 - 5 WBC/hpf   Bacteria, UA NONE SEEN NONE SEEN   Squamous Epithelial / LPF 6-30 (A) NONE SEEN   Mucus PRESENT     EKG: Orders placed or performed during the hospital encounter of 05/13/17  . EKG 12-Lead  . EKG 12-Lead    Imaging Studies: No results found.    Assessment: Menorrhagia Dysmenorrhea Desires permanent sterilization  Plan: Laparoscopic bilateral tubal ligation and hysteroscopy uterine curettage and Minerva endometrial ablation   Florian Buff 05/18/2017 11:37 AM

## 2017-05-18 NOTE — Anesthesia Procedure Notes (Signed)
Procedure Name: Intubation Date/Time: 05/18/2017 12:17 PM Performed by: Adalberto Ill, CRNA Pre-anesthesia Checklist: Patient identified, Emergency Drugs available, Suction available, Patient being monitored and Timeout performed Patient Re-evaluated:Patient Re-evaluated prior to induction Oxygen Delivery Method: Circle system utilized Preoxygenation: Pre-oxygenation with 100% oxygen Induction Type: IV induction Ventilation: Mask ventilation without difficulty Laryngoscope Size: Miller and 2 Grade View: Grade I Tube type: Oral Tube size: 8.0 mm Number of attempts: 1 (brief atraumatic) Airway Equipment and Method: Stylet Placement Confirmation: ETT inserted through vocal cords under direct vision,  positive ETCO2 and breath sounds checked- equal and bilateral Secured at: 22 cm Tube secured with: Tape Dental Injury: Teeth and Oropharynx as per pre-operative assessment

## 2017-05-18 NOTE — Transfer of Care (Signed)
Immediate Anesthesia Transfer of Care Note  Patient: Dawn Carter  Procedure(s) Performed: DILATATION AND CURETTAGE /HYSTEROSCOPY (N/A Vagina ) ENDOMETRIAL ABLATION MINERVA (N/A Vagina ) LAPAROSCOPIC BILATERAL TUBAL LIGATION (Bilateral Abdomen)  Patient Location: PACU  Anesthesia Type:General  Level of Consciousness: awake, alert , oriented and patient cooperative  Airway & Oxygen Therapy: Patient Spontanous Breathing and Patient connected to face mask oxygen  Post-op Assessment: Report given to RN and Patient moving all extremities X 4  Post vital signs: Reviewed and stable  Last Vitals:  Vitals Value Taken Time  BP 149/75 05/18/2017  1:37 PM  Temp    Pulse 106 05/18/2017  1:40 PM  Resp 20 05/18/2017  1:40 PM  SpO2 100 % 05/18/2017  1:40 PM  Vitals shown include unvalidated device data.  Last Pain:  Vitals:   05/18/17 0918  TempSrc: Oral  PainSc: 0-No pain         Complications: No apparent anesthesia complications

## 2017-05-19 NOTE — Addendum Note (Signed)
Addendum  created 05/19/17 0742 by Nicanor Alcon, MD   Attestation recorded in [employer], [place] filed

## 2017-05-20 ENCOUNTER — Telehealth: Payer: Self-pay | Admitting: *Deleted

## 2017-05-20 ENCOUNTER — Encounter (HOSPITAL_COMMUNITY): Payer: Self-pay | Admitting: Obstetrics & Gynecology

## 2017-05-20 NOTE — Telephone Encounter (Signed)
Informed patient that she was intubated during procedure which could be why her neck and mouth are sore.. Also informed she was strapped to the OR table and will be sore just from having surgery.. Encouraged patient to increase activity slowly. Verbalized understanding.

## 2017-05-25 ENCOUNTER — Telehealth: Payer: Self-pay | Admitting: Obstetrics & Gynecology

## 2017-05-25 ENCOUNTER — Telehealth: Payer: Self-pay | Admitting: Obstetrics and Gynecology

## 2017-05-25 MED ORDER — HYDROCODONE-ACETAMINOPHEN 5-325 MG PO TABS: 1.0000 | ORAL_TABLET | Freq: Four times a day (QID) | ORAL | 0 refills | Status: DC | PRN

## 2017-05-25 NOTE — Telephone Encounter (Signed)
Pt called stating that her incision is hurting really bad. She states that it "feels dry". She states that she does not have any drainage or odor coming from the incision. She has no c/o fever. She states that she took her last pain pill this morning. She states that she has an appt tomorrow. Advised to keep her appt and that I would send a request for more pain medication to the provider.

## 2017-05-25 NOTE — Telephone Encounter (Signed)
LMOVM that refill on pain medication had been sent to [facility].

## 2017-05-25 NOTE — Telephone Encounter (Signed)
Meds ordered this encounter  Medications  . HYDROcodone-acetaminophen (NORCO/VICODIN) 5-325 MG tablet    Sig: Take 1 tablet by mouth every 6 (six) hours as needed.    Dispense:  15 tablet    Refill:  0    Pt is post op

## 2017-05-26 ENCOUNTER — Encounter (INDEPENDENT_AMBULATORY_CARE_PROVIDER_SITE_OTHER): Payer: Self-pay

## 2017-05-26 ENCOUNTER — Ambulatory Visit (INDEPENDENT_AMBULATORY_CARE_PROVIDER_SITE_OTHER): Payer: Medicaid Other | Admitting: Obstetrics & Gynecology

## 2017-05-26 ENCOUNTER — Encounter: Payer: Self-pay | Admitting: Obstetrics & Gynecology

## 2017-05-26 VITALS — BP 126/70 | HR 91 | Ht 68.0 in | Wt 228.0 lb

## 2017-05-26 DIAGNOSIS — Z9889 Other specified postprocedural states: Secondary | ICD-10-CM

## 2017-06-30 ENCOUNTER — Telehealth: Payer: Self-pay | Admitting: Obstetrics & Gynecology

## 2017-06-30 NOTE — Telephone Encounter (Signed)
Patient called stating that she would like a call back from Dr. Elonda Husky, Dawn Carter states that she had surgery with Dr. Elonda Husky on 05/18/2017. Pt states that her scar some of it has healed but there is a part that has keloid and there is little bumps on it, she states that she has been applying Neosporin on it with no help . Pt would like to know what is going on, please contact pt

## 2017-06-30 NOTE — Telephone Encounter (Signed)
Patient states she has been having clear drainage from umbilicus, fine bumps noted as well for about a month.  No fever or odor noted from drainage but area has been itching. She has been using neosporin as instructed. Please advise.

## 2017-07-01 NOTE — Telephone Encounter (Signed)
Have her come in sometime next week and I will take a quick look at it

## 2017-07-01 NOTE — Telephone Encounter (Signed)
Informed patient that Dr Elonda Husky says to come in next week and he will take a look at the area.  Patient stated she would call back as she would need to find a ride.

## 2017-07-05 ENCOUNTER — Ambulatory Visit: Payer: Medicaid Other | Admitting: Obstetrics & Gynecology

## 2017-07-07 ENCOUNTER — Ambulatory Visit: Payer: Medicaid Other | Admitting: Obstetrics & Gynecology

## 2017-07-27 NOTE — Progress Notes (Signed)
  HPI: Patient returns for routine postoperative follow-up having undergone laparoscopic bilateral tubal ligation using electrocautery and hysteroscopy D&C Minerva endometrial ablation on 05/18/2017.  The patient's immediate postoperative recovery has been unremarkable. Since hospital discharge the patient reports normal postoperative course.   Current Outpatient Medications: albuterol (PROVENTIL HFA;VENTOLIN HFA) 108 (90 BASE) MCG/ACT inhaler, Inhale 2 puffs into the lungs every 6 (six) hours as needed for wheezing or shortness of breath., Disp: 1 Inhaler, Rfl: 2 b complex vitamins tablet, Take 2 tablets by mouth. Takes 2 tab three times weekly, Disp: , Rfl:  buPROPion (WELLBUTRIN SR) 150 MG 12 hr tablet, Take 150 mg by mouth 2 (two) times daily., Disp: , Rfl:  calcium carbonate (TUMS - DOSED IN MG ELEMENTAL CALCIUM) 500 MG chewable tablet, Chew 2 tablets by mouth daily as needed for indigestion or heartburn., Disp: , Rfl:  cholecalciferol (VITAMIN D) 1000 units tablet, Take 2,000 Units by mouth daily., Disp: , Rfl:  diazepam (VALIUM) 5 MG tablet, Take 5 mg by mouth 4 (four) times daily., Disp: , Rfl:  DULoxetine (CYMBALTA) 60 MG capsule, Take 60 mg by mouth daily., Disp: , Rfl:  EPINEPHrine (EPIPEN 2-PAK) 0.3 mg/0.3 mL IJ SOAJ injection, Inject 0.3 mLs (0.3 mg total) into the muscle once., Disp: 2 Device, Rfl: 1 fluticasone (FLONASE) 50 MCG/ACT nasal spray, Place 1 spray into both nostrils daily as needed for allergies. , Disp: , Rfl:  fluticasone furoate-vilanterol (BREO ELLIPTA) 200-25 MCG/INH AEPB, Inhale 1 puff into the lungs daily., Disp: , Rfl:  hydrochlorothiazide (HYDRODIURIL) 25 MG tablet, Take 25 mg by mouth daily., Disp: , Rfl:  HYDROcodone-acetaminophen (NORCO/VICODIN) 5-325 MG tablet, Take 1 tablet by mouth every 6 (six) hours as needed., Disp: 15 tablet, Rfl: 0 ketorolac (TORADOL) 10 MG tablet, Take 1 tablet (10 mg total) by mouth every 8 (eight) hours as needed., Disp: 15 tablet,  Rfl: 0 levothyroxine (SYNTHROID, LEVOTHROID) 50 MCG tablet, Take 50 mcg by mouth at bedtime. , Disp: , Rfl:  Magnesium 500 MG TABS, Take 500 mg by mouth daily., Disp: , Rfl:  montelukast (SINGULAIR) 10 MG tablet, Take 10 mg by mouth daily., Disp: , Rfl:  nicotine (NICODERM CQ - DOSED IN MG/24 HOURS) 21 mg/24hr patch, Place 21 mg onto the skin daily as needed (smoking cessation)., Disp: , Rfl:  polyethylene glycol (MIRALAX / GLYCOLAX) packet, Take 17 g by mouth daily as needed for moderate constipation., Disp: , Rfl:  Potassium 99 MG TABS, Take 198 mg by mouth daily., Disp: , Rfl:  tiZANidine (ZANAFLEX) 4 MG tablet, Take 8 mg by mouth at bedtime. , Disp: , Rfl:   No current facility-administered medications for this visit.     Blood pressure 126/70, pulse 91, height 5\' 8"  (1.727 m), weight 228 lb (103.4 kg), last menstrual period 05/18/2017.  Physical Exam: Incision normal staples removed Normal post ablation exam  Diagnostic Tests:   Pathology: Benign  Impression: Status post laparoscopic tubal ligation and endometrial ablation with normal postoperative course  Plan: Delay intercourse until all discharge stops  Follow up:   Return if symptoms worsen or fail to improve.   Florian Buff, MD

## 2017-10-12 ENCOUNTER — Encounter (HOSPITAL_COMMUNITY): Payer: Self-pay

## 2017-10-12 ENCOUNTER — Emergency Department (HOSPITAL_COMMUNITY)
Admission: EM | Admit: 2017-10-12 | Discharge: 2017-10-12 | Disposition: A | Discharge status: Home / Self Care | Payer: Medicaid Other | Attending: Emergency Medicine | Admitting: Emergency Medicine

## 2017-10-12 ENCOUNTER — Other Ambulatory Visit: Payer: Self-pay

## 2017-10-12 ENCOUNTER — Emergency Department (HOSPITAL_COMMUNITY): Payer: Medicaid Other

## 2017-10-12 DIAGNOSIS — M25562 Pain in left knee: Secondary | ICD-10-CM | POA: Diagnosis present

## 2017-10-12 DIAGNOSIS — F17210 Nicotine dependence, cigarettes, uncomplicated: Secondary | ICD-10-CM | POA: Diagnosis not present

## 2017-10-12 DIAGNOSIS — J449 Chronic obstructive pulmonary disease, unspecified: Secondary | ICD-10-CM | POA: Insufficient documentation

## 2017-10-12 DIAGNOSIS — Z79899 Other long term (current) drug therapy: Secondary | ICD-10-CM | POA: Insufficient documentation

## 2017-10-12 DIAGNOSIS — E039 Hypothyroidism, unspecified: Secondary | ICD-10-CM | POA: Diagnosis not present

## 2017-10-12 DIAGNOSIS — M25462 Effusion, left knee: Secondary | ICD-10-CM

## 2017-10-12 MED ORDER — NAPROXEN 500 MG PO TABS: 500.0000 mg | ORAL_TABLET | Freq: Two times a day (BID) | ORAL | 0 refills | Status: DC

## 2017-10-12 NOTE — ED Provider Notes (Signed)
[facility] EMERGENCY DEPARTMENT Provider Note   CSN: [id number] Arrival date & time: 10/12/17  7616     History   Chief Complaint Chief Complaint  Patient presents with  . Knee Pain    HPI Dawn Carter is a 47 y.o. female with a history of chronic back pain and arthritis who was in chronic pain management with Dr. Francesco Runner presenting with a 37-month history of left knee pain and swelling.  She denies specific injury, endorses gradual onset of symptoms.  She reports a popping sensation with walking and going up steps.  She has used elevation, ice, heat with no improvement in symptoms.  She take hydrocodone and tizanidine chronically which is not relieving her pain.  She denies redness at the site, no fevers or chills.  She has found no alleviators.  She is scheduled to see an orthopedic specialist in [place] on Sept 6.   The history is provided by the patient.    Past Medical History:  Diagnosis Date  . Anxiety disorder   . Arthritis   . Asthma   . Back pain   . Bipolar 1 disorder ([place])   . COPD (chronic obstructive pulmonary disease) ([place])   . Depression   . Fibroid 04/04/2017  . Hypothyroidism   . Seizures ([place])    1 as child; unknown etiology and on no meds. No more seizures since then.  . Thyroid disease    hypothyroid    Patient Active Problem List   Diagnosis Date Noted  . Fibroid 04/04/2017  . Encounter for gynecological examination with Papanicolaou smear of cervix 03/24/2017  . Dysmenorrhea 03/24/2017  . Menorrhagia with regular cycle 03/24/2017    Past Surgical History:  Procedure Laterality Date  . CERVICAL CONE BIOPSY    . ENDOMETRIAL ABLATION N/A 05/18/2017   Procedure: ENDOMETRIAL ABLATION MINERVA;  Surgeon: Florian Buff, MD;  Location: [facility] ORS;  Service: Gynecology;  Laterality: N/A;  . HYSTEROSCOPY W/D&C N/A 05/18/2017   Procedure: DILATATION AND CURETTAGE /HYSTEROSCOPY;  Surgeon: Florian Buff, MD;  Location: [facility] ORS;  Service: Gynecology;   Laterality: N/A;  . LAPAROSCOPIC TUBAL LIGATION Bilateral 05/18/2017   Procedure: LAPAROSCOPIC BILATERAL TUBAL LIGATION;  Surgeon: Florian Buff, MD;  Location: [facility] ORS;  Service: Gynecology;  Laterality: Bilateral;     OB History    Gravida  7   Para  6   Term      Preterm      AB  1   Living  6     SAB      TAB  1   Ectopic      Multiple      Live Births               Home Medications    Prior to Admission medications   Medication Sig Start Date End Date Taking? Authorizing Provider  albuterol (PROVENTIL HFA;VENTOLIN HFA) 108 (90 BASE) MCG/ACT inhaler Inhale 2 puffs into the lungs every 6 (six) hours as needed for wheezing or shortness of breath. 07/18/14   Rancour, Annie Main, MD  b complex vitamins tablet Take 2 tablets by mouth. Takes 2 tab three times weekly    [provider]  buPROPion (WELLBUTRIN SR) 150 MG 12 hr tablet Take 150 mg by mouth 2 (two) times daily.    [provider]  calcium carbonate (TUMS - DOSED IN MG ELEMENTAL CALCIUM) 500 MG chewable tablet Chew 2 tablets by mouth daily as needed for indigestion or heartburn.  [provider]  cholecalciferol (VITAMIN D) 1000 units tablet Take 2,000 Units by mouth daily.    [provider]  diazepam (VALIUM) 5 MG tablet Take 5 mg by mouth 4 (four) times daily.    [provider]  DULoxetine (CYMBALTA) 60 MG capsule Take 60 mg by mouth daily.    [provider]  EPINEPHrine (EPIPEN 2-PAK) 0.3 mg/0.3 mL IJ SOAJ injection Inject 0.3 mLs (0.3 mg total) into the muscle once. 03/18/14   Ripley Fraise, MD  fluticasone (FLONASE) 50 MCG/ACT nasal spray Place 1 spray into both nostrils daily as needed for allergies.     [provider]  fluticasone furoate-vilanterol (BREO ELLIPTA) 200-25 MCG/INH AEPB Inhale 1 puff into the lungs daily.    [provider]  hydrochlorothiazide (HYDRODIURIL) 25 MG tablet Take 25 mg by mouth daily.    [provider]  HYDROcodone-acetaminophen (NORCO/VICODIN) 5-325 MG tablet Take 1 tablet by mouth every 6 (six) hours as needed. 05/25/17   Florian Buff, MD  ketorolac (TORADOL) 10 MG tablet Take 1 tablet (10 mg total) by mouth every 8 (eight) hours as needed. 05/18/17   Florian Buff, MD  levothyroxine (SYNTHROID, LEVOTHROID) 50 MCG tablet Take 50 mcg by mouth at bedtime.     [provider]  Magnesium 500 MG TABS Take 500 mg by mouth daily.    [provider]  montelukast (SINGULAIR) 10 MG tablet Take 10 mg by mouth daily.    [provider]  naproxen (NAPROSYN) 500 MG tablet Take 1 tablet (500 mg total) by mouth 2 (two) times daily. 10/12/17   Evalee Jefferson, PA-C  nicotine (NICODERM CQ - DOSED IN MG/24 HOURS) 21 mg/24hr patch Place 21 mg onto the skin daily as needed (smoking cessation).    [provider]  polyethylene glycol (MIRALAX / GLYCOLAX) packet Take 17 g by mouth daily as needed for moderate constipation.    [provider]  Potassium 99 MG TABS Take 198 mg by mouth daily.    [provider]  tiZANidine (ZANAFLEX) 4 MG tablet Take 8 mg by mouth at bedtime.     [provider]    Family History Family History  Problem Relation Age of Onset  . Hypertension Paternal Grandfather   . Hypertension Paternal Grandmother   . Diabetes Maternal Grandmother   . Heart attack Maternal Grandmother   . Hypertension Maternal Grandmother   . Hypertension Maternal Grandfather   . Cancer Father        prostate  . Diabetes Mother   . Hypertension Mother   . Heart attack Mother   . Hypertension Brother   . Kidney disease Brother   . Hypertension Sister     Social History Social History   Tobacco Use  . Smoking status: Current Every Day Smoker    Packs/day: 0.00    Years: 20.00    Pack years: 0.00    Types: Cigarettes  . Smokeless tobacco: Never Used  . Tobacco comment: smokes 3-4 cig daily  Substance Use Topics  . Alcohol  use: Not Currently    Frequency: Never  . Drug use: Not Currently    Types: Marijuana    Comment: last used 1 week ago.     Allergies   Coconut fatty acids; Other; Percocet [oxycodone-acetaminophen]; and Chantix [varenicline]   Review of Systems Review of Systems  Constitutional: Negative for fever.  Musculoskeletal: Positive for arthralgias and joint swelling. Negative for myalgias.  Neurological: Negative  for weakness and numbness.     Physical Exam Updated Vital Signs BP 101/61   Pulse (!) 55   Temp 98.1 F (36.7 C) (Oral)   Resp 16   Ht 5\' 8"  (1.727 m)   Wt 102.1 kg   SpO2 94%   BMI 34.21 kg/m   Physical Exam  Constitutional: She is oriented to person, place, and time. She appears well-developed and well-nourished. No distress.  HENT:  Head: Normocephalic and atraumatic.  Right Ear: Tympanic membrane and external ear normal.  Left Ear: Tympanic membrane and external ear normal.  Mouth/Throat: Oropharynx is clear and moist and mucous membranes are normal. No oral lesions. No trismus in the jaw. Dental abscesses present.  Eyes: Conjunctivae are normal.  Neck: Normal range of motion. Neck supple.  Cardiovascular: Normal rate and normal heart sounds.  Pulmonary/Chest: Effort normal.  Abdominal: She exhibits no distension.  Musculoskeletal: She exhibits edema. She exhibits no deformity.       Left knee: She exhibits swelling and bony tenderness. She exhibits normal range of motion, no ecchymosis, no erythema, normal alignment, no LCL laxity and no MCL laxity. Tenderness found. Lateral joint line tenderness noted.  No joint increased warmth or erythema.   Lymphadenopathy:    She has no cervical adenopathy.  Neurological: She is alert and oriented to person, place, and time.  Skin: Skin is warm and dry. No erythema.  Psychiatric: She has a normal mood and affect.     ED Treatments / Results  Labs (all labs ordered are listed, but only abnormal results are  displayed) Labs Reviewed - No data to display  EKG None  Radiology Dg Knee Complete 4 Views Left  Result Date: 10/12/2017 CLINICAL DATA:  LEFT knee pain for 3 months.  No known injury. EXAM: LEFT KNEE - COMPLETE 4+ VIEW COMPARISON:  None. FINDINGS: No fracture, or dislocation. Positive for joint effusion. No erosive arthropathy. Soft tissues otherwise unremarkable. IMPRESSION: Positive for joint effusion.  No fracture or dislocation. Electronically Signed   By: Staci Righter M.D.   On: 10/12/2017 10:25    Procedures Procedures (including critical care time)  Medications Ordered in ED Medications - No data to display   Initial Impression / Assessment and Plan / ED Course  I have reviewed the triage vital signs and the nursing notes.  Pertinent labs & imaging results that were available during my care of the patient were reviewed by me and considered in my medical decision making (see chart for details).     Pt with chronic left knee pain with effusion, suspect meniscal or cartilage injury, no signs of joint infection.  She is on chronic pain medicine, added naproxen, knee sleeve, discussed ice and heat tx, minimize flex/ext.  Referral to ortho for further eval.  Pt agrees with plan.   Final Clinical Impressions(s) / ED Diagnoses   Final diagnoses:  Effusion of left knee    ED Discharge Orders         Ordered    naproxen (NAPROSYN) 500 MG tablet  2 times daily     10/12/17 1050           Evalee Jefferson, Hershal Coria 10/12/17 1220    Mesner, Corene Cornea, MD 10/12/17 1511

## 2017-10-12 NOTE — ED Triage Notes (Signed)
Pt c/o pain in left knee x 3 months.  Denies injury.

## 2017-10-12 NOTE — Discharge Instructions (Addendum)
As discussed, you may have a chronic cartilage injury that does not show up on your xrays today but can explain your pain and swelling in your left knee.  Minimize knee movement (flexing, extending).  Use elevation and heat applied to the joint which may hasten reabsorption of the collected fluid. However, if you have increased activity associated with increased swelling, you may find intermittent ice to be helpful, too.  Continue taking your home pain medicines, adding the anti inflammatory prescribed.

## 2017-10-17 ENCOUNTER — Encounter: Payer: Self-pay | Admitting: Obstetrics & Gynecology

## 2017-12-02 ENCOUNTER — Telehealth (HOSPITAL_COMMUNITY): Payer: Self-pay | Admitting: Internal Medicine

## 2017-12-02 ENCOUNTER — Encounter (HOSPITAL_COMMUNITY): Payer: Self-pay

## 2017-12-02 ENCOUNTER — Ambulatory Visit (HOSPITAL_COMMUNITY): Payer: Medicaid Other

## 2017-12-02 NOTE — Telephone Encounter (Signed)
Patient was dropped off at the hospital  and didn't have a way to get here and will need to reschedule. She will call us when she get home.

## 2017-12-05 ENCOUNTER — Telehealth (HOSPITAL_COMMUNITY): Payer: Self-pay

## 2017-12-05 NOTE — Telephone Encounter (Signed)
Pt called today to schedule PT only. Ask pt if she needs OT for her wrist pain -pt states she never saw MD for that reason. NF 12/05/2017

## 2017-12-15 ENCOUNTER — Ambulatory Visit (HOSPITAL_COMMUNITY): Payer: Medicaid Other | Attending: Orthopaedic Surgery

## 2017-12-15 ENCOUNTER — Encounter (HOSPITAL_COMMUNITY): Payer: Self-pay

## 2017-12-28 ENCOUNTER — Other Ambulatory Visit: Payer: Self-pay

## 2017-12-28 ENCOUNTER — Ambulatory Visit (HOSPITAL_COMMUNITY): Payer: Medicaid Other | Attending: Family Medicine

## 2017-12-28 ENCOUNTER — Encounter (HOSPITAL_COMMUNITY): Payer: Self-pay

## 2017-12-28 DIAGNOSIS — R2689 Other abnormalities of gait and mobility: Secondary | ICD-10-CM | POA: Diagnosis present

## 2017-12-28 DIAGNOSIS — M25562 Pain in left knee: Secondary | ICD-10-CM | POA: Insufficient documentation

## 2017-12-28 DIAGNOSIS — G8929 Other chronic pain: Secondary | ICD-10-CM | POA: Diagnosis present

## 2017-12-28 DIAGNOSIS — R29898 Other symptoms and signs involving the musculoskeletal system: Secondary | ICD-10-CM | POA: Diagnosis present

## 2017-12-28 DIAGNOSIS — M25662 Stiffness of left knee, not elsewhere classified: Secondary | ICD-10-CM | POA: Diagnosis not present

## 2017-12-28 DIAGNOSIS — M6281 Muscle weakness (generalized): Secondary | ICD-10-CM | POA: Insufficient documentation

## 2017-12-28 NOTE — Therapy (Signed)
[facility] [address] [place], [place], [address] Phone: [phone]   Fax:  [phone]  Physical Therapy Evaluation  Patient Details  Name: SHAWNDELL Carter MRN: [id number] Date of Birth: Jun 16, 1970 Referring Provider (PT): Cassell Clement, MD   Encounter Date: 12/28/2017  PT End of Session - 12/28/17 1255    Visit Number  1    Number of Visits  12    Date for PT Re-Evaluation  02/08/18    Authorization Type  Medicaid    Authorization Time Period  Medicaid request for 3 visits 12/28/2017; request 8 additional visits thereafter for a total of 12 visits    Authorization - Visit Number  1    Authorization - Number of Visits  4    PT Start Time  1117    PT Stop Time  1202    PT Time Calculation (min)  45 min    Activity Tolerance  Patient tolerated treatment well;Patient limited by pain;Patient limited by fatigue    Behavior During Therapy  [facility] for tasks assessed/performed       Past Medical History:  Diagnosis Date  . Anxiety disorder   . Arthritis   . Asthma   . Back pain   . Bipolar 1 disorder ([place])   . COPD (chronic obstructive pulmonary disease) ([place])   . Depression   . Fibroid 04/04/2017  . Hypothyroidism   . Seizures ([place])    1 as child; unknown etiology and on no meds. No more seizures since then.  . Thyroid disease    hypothyroid    Past Surgical History:  Procedure Laterality Date  . CERVICAL CONE BIOPSY    . ENDOMETRIAL ABLATION N/A 05/18/2017   Procedure: ENDOMETRIAL ABLATION MINERVA;  Surgeon: Florian Buff, MD;  Location: [facility]S;  Service: Gynecology;  Laterality: N/A;  . HYSTEROSCOPY W/D&C N/A 05/18/2017   Procedure: DILATATION AND CURETTAGE /HYSTEROSCOPY;  Surgeon: Florian Buff, MD;  Location: [facility] ORS;  Service: Gynecology;  Laterality: N/A;  . LAPAROSCOPIC TUBAL LIGATION Bilateral 05/18/2017   Procedure: LAPAROSCOPIC BILATERAL TUBAL LIGATION;  Surgeon: Florian Buff, MD;  Location: [facility];  Service:  Gynecology;  Laterality: Bilateral;    There were no vitals filed for this visit.   Subjective Assessment - 12/28/17 1125    Subjective  Fell in 2015; hasn't fallen since. Left knee pain at knee cap and below. Since saw Dr Michaelle Birks has been to ER, saw Dr Verlon Setting and received a shot which helped until she got home. Took Advil and Tylenol before coming to PT appt today. [place] - closed chain VMO strengthening.     Pertinent History  fibromyalgia, chronic pain, asthma, tobacco use, LBP    Limitations  Standing;Walking;House hold activities;Lifting    How long can you sit comfortably?  30-40 minutes    How long can you stand comfortably?  4-5 minutes before needing to sit down due to pain    How long can you walk comfortably?  Walking uncomfortable from the beginning    Diagnostic tests  XR - Standing AP views of both knees; lateral and sunrise - no joint space narrowing or significant degenerative changes; patellofemoral chondromalacia of left knee.     Patient Stated Goals  Figure out why left knee hurts and learn how to fix it.    Currently in Pain?  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   No      Prior Function   Level of Independence  Independent with basic ADLs;Needs assistance with homemaking    Vocation  Unemployed    Leisure  Watch TV, be around family, sedentary liefstyle, read book or watch movie      Cognition   Overall Cognitive Status  Within Functional Limits for tasks assessed      ROM / Strength   AROM / PROM / Strength  AROM;Strength      AROM   AROM Assessment Site  Knee    Right/Left Knee  Right;Left    Left Knee Extension  1    Left Knee Flexion  92      Strength   Strength Assessment Site  Hip;Knee;Ankle    Right/Left Hip  Right;Left    Right Hip Flexion  4-/5    Right Hip Extension  3/5    Right Hip ABduction  4-/5    Left Hip Flexion  4/5    Left Hip Extension  3-/5    Left Hip ABduction  4-/5    Right/Left Knee  Right;Left    Right Knee Flexion  4-/5    Right Knee Extension  4/5    Left Knee Flexion  4-/5    Left Knee Extension  4/5    Right/Left Ankle  Right;Left    Right Ankle Dorsiflexion  4/5    Left Ankle Dorsiflexion  4-/5      Palpation   Palpation comment  tender to palpation to left inferior and superior patellar tendons, medial joint line, pes anserine, and anterior surface of patella      Transfers   Five time sit to stand comments   21.94 seconds   left foot fwd; missed attempt with left foot back     Ambulation/Gait   Ambulation Distance (Feet)  346 Feet   2MWT   Assistive device  None    Gait Pattern  Step-through pattern;Decreased step length - right;Decreased stance time - left;Decreased stride length;Decreased weight shift to left;Left flexed knee in stance;Ataxic;Trendelenburg;Antalgic    Ambulation Surface  Level;Indoor    Gait velocity  0.96 m/s      Balance   Balance Assessed  Yes      Static Standing Balance  Static Standing - Comment/# of Minutes  Rt - 10 sec; Lt 3 sec                 Objective measurements completed on examination: See above findings.              PT Education - 12/28/17 1254    Education Details  Examination findings and plan of care; importance of biomechanics and performance of HEP.     Person(s) Educated  Patient    Methods  Explanation;Demonstration;Handout    Comprehension  Verbalized understanding;Returned demonstration       PT Short Term Goals - 12/28/17 1741      PT SHORT TERM GOAL #1   Title  Patient will demonstrate 120 degrees of left knee AROM into flexion to improve her ability to normalize gait and go up and down stairs.     Baseline  initial - 92 degrees    Time  2    Period  Weeks    Status  New    Target Date  01/11/18      PT SHORT TERM GOAL #2   Title  Patient will be independent in initial HEP and complete on a regular basis correctly.     Time  2    Period  Weeks    Status  New        PT Long Term Goals - 12/28/17 1743      PT LONG TERM GOAL #1   Title  Patient will be independent in advanced HEP and perform on a regular basis to continue strength, balance and pain, and functional improvements and maintenance after discharge from PT.     Time  6    Period  Weeks    Status  New    Target Date  02/08/18      PT LONG TERM GOAL #2   Title  Patient will exhibit full AROM of left knee from 0 to at least 130 degrees to exhibit reduced pain and improved functional abilities.     Baseline  initial - 1-92 degrees    Time  6    Period  Weeks    Status  New      PT LONG TERM GOAL #3   Title  Patient will be able to stand on either foot for > 20 seconds to indicate reduced fall risk and improved functional ability.     Baseline  initial - Rt 10 sec; Lt 3 sec    Time  6    Period  Weeks    Status  New      PT LONG TERM GOAL #4   Title  Patient will exhibit 1/2 grade improvement in MMT throughout bilateral LEs to indicate improved functional strength of bilateral LEs and reduce  stresses on left knee.     Baseline  see MMT    Time  6    Period  Weeks    Status  New             Plan - 12/28/17 1300    Clinical Impression Statement  Patient is a 47 year old female presenting to outpatient physical therapy with complaints of left knee pain and a diagnosis of patellofemoral chondromalacia. Patient did receive an injection in her left knee which has reduced/changed her pain some, however, she continues to exhibits pain complaints, decreased lower extremity strength, decreased left knee AROM, antalgic gait, impaired balance and impaired functional abilities. Patient was quite tender  to palpation over the inferior and superior patellar tendons, as well as the patella itself and medial joint line and pes anserine, all on the left. Patient would benefit from skilled physical therapy to address the aforementioned deficits and improved her QOL.     History and Personal Factors relevant to plan of care:  fibromyalgia, chronic pain, asthma, tobacco use, LBP    Clinical Presentation  Stable    Clinical Presentation due to:  AROM, MMT, gait, SLS, 5xSTS, 2MWT, pain, clinical judgement    Clinical Decision Making  Low    Rehab Potential  Fair    Clinical Impairments Affecting Rehab Potential  positive - injection helped reduce/change pain; negative - BMI, chronicity of left knee pain, PMH of chronic pain and fibromyalgia    PT Frequency  2x / week    PT Duration  6 weeks    PT Treatment/Interventions  Iontophoresis 4mg /ml Dexamethasone;Aquatic Therapy;Gait training;Stair training;Therapeutic activities;Therapeutic exercise;Balance training;Neuromuscular re-education;Patient/family education;Manual techniques;Passive range of motion;Dry needling;Energy conservation;Taping;Joint Manipulations    PT Next Visit Plan  review goals, eval and HEP; initiate bilateral LE strengthening and AROM with knee drives, ect, VMO strengthening; add to HEP; progress to balance as able    PT Home  Exercise Plan  initial - heel slides    Consulted and Agree with Plan of Care  Patient       Patient will benefit from skilled therapeutic intervention in order to improve the following deficits and impairments:  Abnormal gait, Increased fascial restricitons, Pain, Decreased mobility, Decreased activity tolerance, Decreased endurance, Decreased range of motion, Decreased strength, Decreased balance, Difficulty walking  Visit Diagnosis: Stiffness of left knee, not elsewhere classified  Chronic pain of left knee  Muscle weakness (generalized)  Other symptoms and signs involving the musculoskeletal system  Other abnormalities of gait and mobility     Problem List Patient Active Problem List   Diagnosis Date Noted  . Fibroid 04/04/2017  . Encounter for gynecological examination with Papanicolaou smear of cervix 03/24/2017  . Dysmenorrhea 03/24/2017  . Menorrhagia with regular cycle 03/24/2017    Floria Raveling. Hartnett-Rands, MS, PT Per Maria Antonia #[id number] 12/28/2017, 5:50 PM  [facility] [address] [place], [place], [address] Phone: [phone]   Fax:  [phone]  Name: Dawn Carter MRN: [id number] Date of Birth: 01/14/1971

## 2017-12-28 NOTE — Patient Instructions (Signed)
Heel Slide    Bend knee and pull heel toward buttocks. Hold _10___ seconds. Return. Repeat with other knee. Repeat _10___ times. Do _2-3___ sessions per day.  [email or web address].exer.us/372   Copyright  VHI. All rights reserved.

## 2018-01-04 ENCOUNTER — Encounter (HOSPITAL_COMMUNITY): Payer: Self-pay

## 2018-01-04 ENCOUNTER — Ambulatory Visit (HOSPITAL_COMMUNITY): Payer: Medicaid Other

## 2018-01-04 DIAGNOSIS — M25662 Stiffness of left knee, not elsewhere classified: Secondary | ICD-10-CM

## 2018-01-04 DIAGNOSIS — R29898 Other symptoms and signs involving the musculoskeletal system: Secondary | ICD-10-CM

## 2018-01-04 DIAGNOSIS — G8929 Other chronic pain: Secondary | ICD-10-CM

## 2018-01-04 DIAGNOSIS — M6281 Muscle weakness (generalized): Secondary | ICD-10-CM

## 2018-01-04 DIAGNOSIS — M25562 Pain in left knee: Secondary | ICD-10-CM

## 2018-01-04 DIAGNOSIS — R2689 Other abnormalities of gait and mobility: Secondary | ICD-10-CM

## 2018-01-04 NOTE — Therapy (Signed)
[facility] [address] [place], [place], [address] Phone: [phone]   Fax:  [phone]  Physical Therapy Treatment  Patient Details  Name: Dawn Carter MRN: [id number] Date of Birth: [birth year] Referring Provider (PT): Cassell Clement, MD   Encounter Date: 01/04/2018  PT End of Session - 01/04/18 1028    Visit Number  2    Number of Visits  12    Date for PT Re-Evaluation  02/08/18    Authorization Type  Medicaid    Authorization Time Period  Medicaid request for 3 visits 12/28/2017; request 8 additional visits thereafter for a total of 12 visits    Authorization - Visit Number  2    Authorization - Number of Visits  4    PT Start Time  [phone]    PT Stop Time  1032    PT Time Calculation (min)  44 min    Activity Tolerance  Patient tolerated treatment well;Patient limited by pain;Patient limited by fatigue    Behavior During Therapy  [facility] for tasks assessed/performed       Past Medical History:  Diagnosis Date  . Anxiety disorder   . Arthritis   . Asthma   . Back pain   . Bipolar 1 disorder ([place])   . COPD (chronic obstructive pulmonary disease) ([place])   . Depression   . Fibroid 04/04/2017  . Hypothyroidism   . Seizures ([place])    1 as child; unknown etiology and on no meds. No more seizures since then.  . Thyroid disease    hypothyroid    Past Surgical History:  Procedure Laterality Date  . CERVICAL CONE BIOPSY    . ENDOMETRIAL ABLATION N/A 05/18/2017   Procedure: ENDOMETRIAL ABLATION MINERVA;  Surgeon: Florian Buff, MD;  Location: [facility];  Service: Gynecology;  Laterality: N/A;  . HYSTEROSCOPY W/D&C N/A 05/18/2017   Procedure: DILATATION AND CURETTAGE /HYSTEROSCOPY;  Surgeon: Florian Buff, MD;  Location: [facility];  Service: Gynecology;  Laterality: N/A;  . LAPAROSCOPIC TUBAL LIGATION Bilateral 05/18/2017   Procedure: LAPAROSCOPIC BILATERAL TUBAL LIGATION;  Surgeon: Florian Buff, MD;  Location: [facility];  Service:  Gynecology;  Laterality: Bilateral;    There were no vitals filed for this visit.  Subjective Assessment - 01/04/18 0951    Subjective  Pt stated knee is feeling better today, has been compliant with HEP multiple times a day and feels she has better walking.  Current pain scale 3/10 achey pain.     Pertinent History  fibromyalgia, chronic pain, asthma, tobacco use, LBP    Patient Stated Goals  Figure out why left knee hurts and learn how to fix it.    Currently in Pain?  Yes    Pain Score  3     Pain Location  Knee    Pain Orientation  Left;Anterior;Lower   below patells   Pain Descriptors / Indicators  Aching    Pain Type  Chronic pain    Pain Onset  More than a month ago    Pain Frequency  Intermittent    Aggravating Factors   standing, walking, washing dishes, standing to bath    Pain Relieving Factors  sitting down, Advil, Tylenol, heating pad, cold compress     Effect of Pain on Daily Activities  limits                       [facility] Adult PT Treatment/Exercise - 01/04/18 0001  Exercises   Exercises  Knee/Hip      Knee/Hip Exercises: Stretches   Knee: Self-Stretch to increase Flexion  5 reps;10 seconds    Knee: Self-Stretch Limitations  knee drive 5x 10" holds on 12in step      Knee/Hip Exercises: Standing   Lateral Step Up  Left;10 reps;Hand Hold: 1;Step Height: 4"    Lateral Step Up Limitations  decreased popping with reps, no pain    Forward Step Up  Left;10 reps;Hand Hold: 0;Step Height: 6"    Wall Squat  5 reps;3 seconds    Wall Squat Limitations  ball between knees for VMO    SLS  Rt 31", Lt 28"    Other Standing Knee Exercises  tandem stance 2x 30"      Knee/Hip Exercises: Supine   Short Arc Quad Sets  10 reps    Heel Slides  10 reps    Bridges  10 reps    Straight Leg Raises  10 reps    Knee Extension  AROM    Knee Extension Limitations  1   c/o popping wiht quad set   Knee Flexion  AROM    Knee Flexion Limitations  130   was 92             PT Education - 01/04/18 1312    Education Details  Reviewed goals, assured compliance with HEP and additional HEP given for quad and gluteal strengthening.  Reviewed RICE technqiues for pain and edema control following exercise    Person(s) Educated  Patient    Methods  Explanation;Demonstration    Comprehension  Verbalized understanding       PT Short Term Goals - 12/28/17 1741      PT SHORT TERM GOAL #1   Title  Patient will demonstrate 120 degrees of left knee AROM into flexion to improve her ability to normalize gait and go up and down stairs.     Baseline  initial - 92 degrees    Time  2    Period  Weeks    Status  New    Target Date  01/11/18      PT SHORT TERM GOAL #2   Title  Patient will be independent in initial HEP and complete on a regular basis correctly.     Time  2    Period  Weeks    Status  New        PT Long Term Goals - 12/28/17 1743      PT LONG TERM GOAL #1   Title  Patient will be independent in advanced HEP and perform on a regular basis to continue strength, balance and pain, and functional improvements and maintenance after discharge from PT.     Time  6    Period  Weeks    Status  New    Target Date  02/08/18      PT LONG TERM GOAL #2   Title  Patient will exhibit full AROM of left knee from 0 to at least 130 degrees to exhibit reduced pain and improved functional abilities.     Baseline  initial - 1-92 degrees    Time  6    Period  Weeks    Status  New      PT LONG TERM GOAL #3   Title  Patient will be able to stand on either foot for > 20 seconds to indicate reduced fall risk and improved functional ability.     Baseline  initial - Rt 10 sec; Lt 3 sec    Time  6    Period  Weeks    Status  New      PT LONG TERM GOAL #4   Title  Patient will exhibit 1/2 grade improvement in MMT throughout bilateral LEs to indicate improved functional strength of bilateral LEs and reduce stresses on left knee.     Baseline  see MMT     Time  6    Period  Weeks    Status  New            Plan - 01/04/18 1029    Clinical Impression Statement  Reviewed goals and assured compliance iwth HEP.  Session focus on knee mobilty and quad/gluteal strengthening.  Pt with vast improvements wiht improved AROM 1-130 degrees (was 1-92 degress last session).  Progressed to CKC for quad, gluteal and VMO strengthening, pt able to complete all exercises with good form following initial instruction and min cueing for equal weight bearing during wall squats.  EOS reports pain scale reduced to 1/10.    Rehab Potential  Fair    Clinical Impairments Affecting Rehab Potential  positive - injection helped reduce/change pain; negative - BMI, chronicity of left knee pain, PMH of chronic pain and fibromyalgia    PT Frequency  2x / week    PT Duration  6 weeks    PT Treatment/Interventions  Iontophoresis 4mg /ml Dexamethasone;Aquatic Therapy;Gait training;Stair training;Therapeutic activities;Therapeutic exercise;Balance training;Neuromuscular re-education;Patient/family education;Manual techniques;Passive range of motion;Dry needling;Energy conservation;Taping;Joint Manipulations    PT Next Visit Plan  initiate bilateral LE strengthening and AROM with knee drives, ect, VMO strengthening; add to HEP; progress to balance as able    PT Home Exercise Plan  initial - heel slides; 11/20: bridge, SLR, SLR wiht ER and knee drives       Patient will benefit from skilled therapeutic intervention in order to improve the following deficits and impairments:  Abnormal gait, Increased fascial restricitons, Pain, Decreased mobility, Decreased activity tolerance, Decreased endurance, Decreased range of motion, Decreased strength, Decreased balance, Difficulty walking  Visit Diagnosis: Stiffness of left knee, not elsewhere classified  Chronic pain of left knee  Muscle weakness (generalized)  Other symptoms and signs involving the musculoskeletal system  Other  abnormalities of gait and mobility     Problem List Patient Active Problem List   Diagnosis Date Noted  . Fibroid 04/04/2017  . Encounter for gynecological examination with Papanicolaou smear of cervix 03/24/2017  . Dysmenorrhea 03/24/2017  . Menorrhagia with regular cycle 03/24/2017   Ihor Austin, LPTA; [facility]  Aldona Lento 01/04/2018, 1:14 PM  [place], [place], [address] Phone: [phone]   Fax:  [phone]  Name: Dawn Carter MRN: [id number] Date of Birth: [birth year]

## 2018-01-04 NOTE — Patient Instructions (Signed)
Bridge    Lie back, legs bent. Inhale, pressing hips up. Keeping ribs in, lengthen lower back. Exhale, rolling down along spine from top. Repeat 10 times. Do 1-2 sessions per day.  http://pm.exer.us/55   Copyright  VHI. All rights reserved.   Straight Leg Raise    Tighten stomach and slowly raise locked right leg 10 inches from floor. Repeat 10 times per set. Do 1-2 sets per session. Do 2 sessions per day.  http://orth.exer.us/1103   Copyright  VHI. All rights reserved.   Straight Leg Raise: With External Leg Rotation    Lie on back with right leg straight, opposite leg bent. Rotate straight leg out and lift 10 inches. Repeat 10 times per set. Do 1-2 sets per session. Do 1 sessions per day.  http://orth.exer.us/729   Copyright  VHI. All rights reserved.

## 2018-01-06 ENCOUNTER — Telehealth (HOSPITAL_COMMUNITY): Payer: Self-pay

## 2018-01-06 ENCOUNTER — Ambulatory Visit (HOSPITAL_COMMUNITY): Payer: Medicaid Other

## 2018-01-06 NOTE — Telephone Encounter (Signed)
No show, called and spoke to pt who stated she has significant amount of pain, pain scale 12/10, unable to bear weight on LE.  Pt encouraged to contact MD if continues.  Reminded next apt date and time with contact info given.    [address], [place]; [facility] [phone]

## 2018-01-10 ENCOUNTER — Encounter (HOSPITAL_COMMUNITY): Payer: Self-pay

## 2018-01-10 ENCOUNTER — Ambulatory Visit (HOSPITAL_COMMUNITY): Payer: Medicaid Other

## 2018-01-10 DIAGNOSIS — M25562 Pain in left knee: Secondary | ICD-10-CM

## 2018-01-10 DIAGNOSIS — R29898 Other symptoms and signs involving the musculoskeletal system: Secondary | ICD-10-CM

## 2018-01-10 DIAGNOSIS — M6281 Muscle weakness (generalized): Secondary | ICD-10-CM

## 2018-01-10 DIAGNOSIS — M25662 Stiffness of left knee, not elsewhere classified: Secondary | ICD-10-CM | POA: Diagnosis not present

## 2018-01-10 DIAGNOSIS — R2689 Other abnormalities of gait and mobility: Secondary | ICD-10-CM

## 2018-01-10 DIAGNOSIS — G8929 Other chronic pain: Secondary | ICD-10-CM

## 2018-01-10 NOTE — Patient Instructions (Signed)
Tandem Stance    Right foot in front of left, heel touching toe both feet "straight ahead". Stand on Foot Triangle of Support with both feet. Balance in this position _30_ seconds. Do with left foot in front of right. Can add faom couch cushion OR close eyes to make more difficult.  Copyright  VHI. All rights reserved.  Balance: Eyes Open - Unilateral (Varied Surfaces)    Stand on left foot, eyes open. Maintain balance _30___ seconds. Repeat 2____ times per set on each leg. Do _2___ sets per session.  Repeat on compliant surface: foam.  Copyright  VHI. All rights reserved.

## 2018-01-10 NOTE — Therapy (Signed)
[facility] [place], [place], [address] Phone: [phone]   Fax:  [phone]  Physical Therapy Treatment  Patient Details  Name: Dawn Carter MRN: [id number] Date of Birth: 1970/09/12 Referring Provider (PT): Cassell Clement, MD   Encounter Date: 01/10/2018  PT End of Session - 01/10/18 0858    Visit Number  3    Number of Visits  12    Date for PT Re-Evaluation  02/08/18    Authorization Type  Medicaid    Authorization Time Period  Medicaid request for 3 visits 12/28/2017; request 8 additional visits thereafter for a total of 12 visits    Authorization - Visit Number  3   3 visits approved 11/18-12/02/2017   Authorization - Number of Visits  4    PT Start Time  0901    PT Stop Time  0943    PT Time Calculation (min)  42 min    Activity Tolerance  Patient tolerated treatment well;Patient limited by pain;Patient limited by fatigue    Behavior During Therapy  [facility] for tasks assessed/performed       Past Medical History:  Diagnosis Date  . Anxiety disorder   . Arthritis   . Asthma   . Back pain   . Bipolar 1 disorder ([place])   . COPD (chronic obstructive pulmonary disease) ([place])   . Depression   . Fibroid 04/04/2017  . Hypothyroidism   . Seizures ([place])    1 as child; unknown etiology and on no meds. No more seizures since then.  . Thyroid disease    hypothyroid    Past Surgical History:  Procedure Laterality Date  . CERVICAL CONE BIOPSY    . ENDOMETRIAL ABLATION N/A 05/18/2017   Procedure: ENDOMETRIAL ABLATION MINERVA;  Surgeon: Florian Buff, MD;  Location: [facility];  Service: Gynecology;  Laterality: N/A;  . HYSTEROSCOPY W/D&C N/A 05/18/2017   Procedure: DILATATION AND CURETTAGE /HYSTEROSCOPY;  Surgeon: Florian Buff, MD;  Location: [facility];  Service: Gynecology;  Laterality: N/A;  . LAPAROSCOPIC TUBAL LIGATION Bilateral 05/18/2017   Procedure: LAPAROSCOPIC BILATERAL TUBAL LIGATION;  Surgeon: Florian Buff,  MD;  Location: [facility];  Service: Gynecology;  Laterality: Bilateral;    There were no vitals filed for this visit.  Subjective Assessment - 01/10/18 0857    Subjective  Patient reports no pain today. Took a muscle relaxer before coming to visit today. Sleepy.     Pertinent History  fibromyalgia, chronic pain, asthma, tobacco use, LBP    Patient Stated Goals  Figure out why left knee hurts and learn how to fix it.    Currently in Pain?  No/denies    Pain Onset  More than a month ago                       [facility] Adult PT Treatment/Exercise - 01/10/18 0001      Exercises   Exercises  Knee/Hip      Knee/Hip Exercises: Stretches   Knee: Self-Stretch to increase Flexion  5 reps;10 seconds    Knee: Self-Stretch Limitations  knee drive 5x 10" holds on 12in step    Gastroc Stretch  Both;2 reps;30 seconds    Gastroc Stretch Limitations  slantboard      Knee/Hip Exercises: Standing   Heel Raises  Both;1 set;15 reps    Heel Raises Limitations  + toe raises    Forward Lunges  Left;1 set;10 reps  Lateral Step Up  Left;10 reps;Hand Hold: 1;Step Height: 4"    Lateral Step Up Limitations  no c/o popping today    Forward Step Up  Left;10 reps;Hand Hold: 0;Step Height: 6"    Wall Squat  5 reps;3 seconds    Wall Squat Limitations  ball between knees for VMO    SLS  30 sec; both 1 set solid; 1 set foam complaint    Other Standing Knee Exercises  tandem stance 2x 30"; 1 set solid; 1 set foam complaint      Knee/Hip Exercises: Supine   Short Arc Quad Sets  Strengthening;Left;1 set;10 reps    Short Arc Quad Sets Limitations  3 sec hold             PT Education - 01/10/18 [phone]    Education Details  Discussed purpose and technique of interventions throughout session. Advanced HEP.     Person(s) Educated  Patient    Methods  Explanation;Demonstration;Handout    Comprehension  Verbalized understanding;Returned demonstration       PT Short Term Goals - 01/10/18 0941       PT SHORT TERM GOAL #1   Title  Patient will demonstrate 120 degrees of left knee AROM into flexion to improve her ability to normalize gait and go up and down stairs.     Baseline  initial - 92 degrees    Time  2    Period  Weeks    Status  Achieved      PT SHORT TERM GOAL #2   Title  Patient will be independent in initial HEP and complete on a regular basis correctly.     Time  2    Period  Weeks    Status  Achieved        PT Long Term Goals - 01/10/18 0941      PT LONG TERM GOAL #1   Title  Patient will be independent in advanced HEP and perform on a regular basis to continue strength, balance and pain, and functional improvements and maintenance after discharge from PT.     Time  6    Period  Weeks    Status  On-going      PT LONG TERM GOAL #2   Title  Patient will exhibit full AROM of left knee from 0 to at least 130 degrees to exhibit reduced pain and improved functional abilities.     Baseline  initial - 1-92 degrees    Time  6    Period  Weeks    Status  Partially Met      PT LONG TERM GOAL #3   Title  Patient will be able to stand on either foot for > 20 seconds to indicate reduced fall risk and improved functional ability.     Baseline  initial - Rt 10 sec; Lt 3 sec    Time  6    Period  Weeks    Status  Partially Met      PT LONG TERM GOAL #4   Title  Patient will exhibit 1/2 grade improvement in MMT throughout bilateral LEs to indicate improved functional strength of bilateral LEs and reduce stresses on left knee.     Baseline  see MMT    Time  6    Period  Weeks    Status  On-going            Plan - 01/10/18 0859    Clinical Impression Statement  Reviewed  HEP assured compliance. Continued with established plan of care. Session focus on quad/gluteal strengthening and balance training. Pt continued with vast improvements in AROM. Continued with progressed CKC for quad, gluteal and VMO strengthening,  and advanced balance to foam complaint for tandem and  SLS. Advanced HEP to include tandem and SLS balance. Pt able to complete all exercises with good form following initial instruction and min cueing for equal weight bearing and good knee alignment. EOS reports pain scale reduced to o/10 in left knee and low back.    Rehab Potential  Fair    Clinical Impairments Affecting Rehab Potential  positive - injection helped reduce/change pain; negative - BMI, chronicity of left knee pain, PMH of chronic pain and fibromyalgia    PT Frequency  2x / week    PT Duration  6 weeks    PT Treatment/Interventions  Iontophoresis 6m/ml Dexamethasone;Aquatic Therapy;Gait training;Stair training;Therapeutic activities;Therapeutic exercise;Balance training;Neuromuscular re-education;Patient/family education;Manual techniques;Passive range of motion;Dry needling;Energy conservation;Taping;Joint Manipulations    PT Next Visit Plan  initiate bilateral LE strengthening and AROM with knee drives, ect, VMO strengthening; add to HEP; progress to balance as able    PT Home Exercise Plan  initial - heel slides; 11/20: bridge, SLR, SLR wiht ER and knee drives       Patient will benefit from skilled therapeutic intervention in order to improve the following deficits and impairments:  Abnormal gait, Increased fascial restricitons, Pain, Decreased mobility, Decreased activity tolerance, Decreased endurance, Decreased range of motion, Decreased strength, Decreased balance, Difficulty walking  Visit Diagnosis: Stiffness of left knee, not elsewhere classified  Chronic pain of left knee  Muscle weakness (generalized)  Other symptoms and signs involving the musculoskeletal system  Other abnormalities of gait and mobility     Problem List Patient Active Problem List   Diagnosis Date Noted  . Fibroid 04/04/2017  . Encounter for gynecological examination with Papanicolaou smear of cervix 03/24/2017  . Dysmenorrhea 03/24/2017  . Menorrhagia with regular cycle 03/24/2017     BFloria Raveling Hartnett-Rands, MS, PT Per DHettinger##[id number]/26/2019, 9:47 AM  [place] [address]Phone: [phone]  Fax:  [phone] Name: Dawn VENOMRN: [id number] of Birth: 212-06-1970

## 2018-01-16 ENCOUNTER — Telehealth (HOSPITAL_COMMUNITY): Payer: Self-pay | Admitting: Internal Medicine

## 2018-01-16 NOTE — Telephone Encounter (Signed)
01/16/18  pt called and rescehduled said she really needed the appt for Thursday

## 2018-01-18 ENCOUNTER — Ambulatory Visit (HOSPITAL_COMMUNITY): Payer: Medicaid Other

## 2018-01-19 ENCOUNTER — Ambulatory Visit (HOSPITAL_COMMUNITY): Payer: Medicaid Other | Attending: Family Medicine

## 2018-01-19 DIAGNOSIS — R29898 Other symptoms and signs involving the musculoskeletal system: Secondary | ICD-10-CM

## 2018-01-19 DIAGNOSIS — R2689 Other abnormalities of gait and mobility: Secondary | ICD-10-CM | POA: Insufficient documentation

## 2018-01-19 DIAGNOSIS — M25562 Pain in left knee: Secondary | ICD-10-CM | POA: Diagnosis present

## 2018-01-19 DIAGNOSIS — M25662 Stiffness of left knee, not elsewhere classified: Secondary | ICD-10-CM | POA: Insufficient documentation

## 2018-01-19 DIAGNOSIS — M6281 Muscle weakness (generalized): Secondary | ICD-10-CM | POA: Insufficient documentation

## 2018-01-19 DIAGNOSIS — G8929 Other chronic pain: Secondary | ICD-10-CM | POA: Diagnosis present

## 2018-01-19 NOTE — Therapy (Signed)
[place], [place], [address] Phone: [phone]   Fax:  [phone]  Physical Therapy Treatment/Re-Evaluation  Patient Details  Name: Dawn Carter MRN: [id number] Date of Birth: [birth year] Referring Provider (PT): Cassell Clement, MD   Encounter Date: 01/19/2018  PT End of Session - 01/19/18 1113    Visit Number  4    Number of Visits  12    Date for PT Re-Evaluation  02/08/18    Authorization Type  Medicaid    Authorization Time Period  Medicaid request for 3 visits 12/28/2017; request 8 additional visits thereafter for a total of 12 visits    Authorization - Visit Number  4   3 visits approved 11/18-12/02/2017   Authorization - Number of Visits  4    PT Start Time  1118    PT Stop Time  1200    PT Time Calculation (min)  42 min    Activity Tolerance  Patient tolerated treatment well;Patient limited by pain;Patient limited by fatigue    Behavior During Therapy  [facility] for tasks assessed/performed       Past Medical History:  Diagnosis Date  . Anxiety disorder   . Arthritis   . Asthma   . Back pain   . Bipolar 1 disorder ([place])   . COPD (chronic obstructive pulmonary disease) ([place])   . Depression   . Fibroid 04/04/2017  . Hypothyroidism   . Seizures ([place])    1 as child; unknown etiology and on no meds. No more seizures since then.  . Thyroid disease    hypothyroid    Past Surgical History:  Procedure Laterality Date  . CERVICAL CONE BIOPSY    . ENDOMETRIAL ABLATION N/A 05/18/2017   Procedure: ENDOMETRIAL ABLATION MINERVA;  Surgeon: Florian Buff, MD;  Location: [facility];  Service: Gynecology;  Laterality: N/A;  . HYSTEROSCOPY W/D&C N/A 05/18/2017   Procedure: DILATATION AND CURETTAGE /HYSTEROSCOPY;  Surgeon: Florian Buff, MD;  Location: [facility];  Service: Gynecology;  Laterality: N/A;  . LAPAROSCOPIC TUBAL LIGATION Bilateral 05/18/2017   Procedure: LAPAROSCOPIC BILATERAL TUBAL LIGATION;  Surgeon:  Florian Buff, MD;  Location: [facility];  Service: Gynecology;  Laterality: Bilateral;    There were no vitals filed for this visit.  Subjective Assessment - 01/19/18 1112    Subjective  Feels 80% better than when she started PT. Can walk without feeling like she is "going to fall out." Has been incorporating her HEP into daily activities like climbing her 14 stairs to go to bed and standing on her left leg when she is washing dishes.     Pertinent History  fibromyalgia, chronic pain, asthma, tobacco use, LBP    Patient Stated Goals  Figure out why left knee hurts and learn how to fix it.    Currently in Pain?  Yes    Pain Score  5     Pain Location  Knee    Pain Orientation  Left;Anterior;Lower    Pain Descriptors / Indicators  Aching    Pain Onset  More than a month ago         [facility] PT Assessment - 01/19/18 0001      AROM   Left Knee Extension  0    Left Knee Flexion  135      Strength   Right Hip Flexion  4/5   4-/5   Right Hip Extension  3+/5   was 3/5   Right  Hip ABduction  4/5   was 4-/5   Left Hip Flexion  4+/5   was 4/5   Left Hip Extension  3+/5   was 3-/5   Left Hip ABduction  4/5   was 4-/5   Right Knee Flexion  4/5   was 4-/5   Right Knee Extension  4+/5   was 4/5   Left Knee Flexion  4/5   was 4-/5   Left Knee Extension  4+/5   was 4/5   Right Ankle Dorsiflexion  4+/5   was 4/5   Left Ankle Dorsiflexion  4+/5   was 4-/5     Palpation   Palpation comment  tender to palpation to left inferior and superior patellar tendons, medial joint line, pes anserine, and anterior surface of patella      Transfers   Five time sit to stand comments   16.38 seconds   was 21/94     Static Standing Balance   Static Standing - Comment/# of Minutes  Rt 30 sec; Lt 30 seconds                   [facility] Adult PT Treatment/Exercise - 01/19/18 0001      Exercises   Exercises  Knee/Hip      Knee/Hip Exercises: Stretches   Gastroc Stretch  Both;2 reps;30  seconds    Gastroc Stretch Limitations  slantboard      Knee/Hip Exercises: Standing   Lateral Step Up  Left;10 reps;Step Height: 6"    Lateral Step Up Limitations  no c/o popping today    Forward Step Up  Left;10 reps;Hand Hold: 0;Step Height: 6"    Wall Squat  2 sets;5 reps;3 seconds    Wall Squat Limitations  ball between knees for VMO      Knee/Hip Exercises: Supine   Bridges  10 reps    Knee Extension  AROM    Knee Extension Limitations  0    Knee Flexion  AROM    Knee Flexion Limitations  135    Other Supine Knee/Hip Exercises  4-way hip x10 Lt LE             PT Education - 01/19/18 1300    Education Details  Discussed purpose and technique of interventions throughout session. Advanced HEP.     Person(s) Educated  Patient    Methods  Explanation;Demonstration;Handout    Comprehension  Verbalized understanding;Returned demonstration       PT Short Term Goals - 01/19/18 1354      PT SHORT TERM GOAL #1   Title  Patient will demonstrate 120 degrees of left knee AROM into flexion to improve her ability to normalize gait and go up and down stairs.     Baseline  initial - 92 degrees    Time  2    Period  Weeks    Status  Achieved      PT SHORT TERM GOAL #2   Title  Patient will be independent in initial HEP and complete on a regular basis correctly.     Time  2    Period  Weeks    Status  Achieved        PT Long Term Goals - 01/19/18 1354      PT LONG TERM GOAL #1   Title  Patient will be independent in advanced HEP and perform on a regular basis to continue strength, balance and pain, and functional improvements and maintenance after discharge from  PT.     Time  6    Period  Weeks    Status  On-going      PT LONG TERM GOAL #2   Title  Patient will exhibit full AROM of left knee from 0 to at least 130 degrees to exhibit reduced pain and improved functional abilities.     Baseline  initial - 1-92 degrees; 01/19/18 - 0-135    Time  6    Period  Weeks     Status  Achieved      PT LONG TERM GOAL #3   Title  Patient will be able to stand on either foot for > 20 seconds to indicate reduced fall risk and improved functional ability.     Baseline  initial - Rt 10 sec; Lt 3 sec; 01/19/18 - 30 seconds on either foot    Time  6    Period  Weeks    Status  Achieved      PT LONG TERM GOAL #4   Title  Patient will exhibit 1/2 grade improvement in MMT throughout bilateral LEs to indicate improved functional strength of bilateral LEs and reduce stresses on left knee.     Baseline  see MMT    Time  6    Period  Weeks    Status  Achieved      PT LONG TERM GOAL #5   Title  Patient to complain of no > than 2/10 pain in left knee for one week to indicate improved function, reduced pain, and decreased inflammation.     Baseline  01/19/18 - 5/10    Time  4    Period  Weeks    Status  New            Plan - 01/19/18 1113    Clinical Impression Statement  Reviewed HEP assured compliance. Continued with established plan of care. Re-evaluated patient status for insurance review. Pt continued with vast improvements in AROM, pain complaints, single limb stance, 5xSTS, MMT, and tenderness to palpation over left inferior patellar tendon. Patient continues to exhibit deficits in these areas but is improving well. Advanced HEP to include 4-way hip strengthening against gravity. Pt able to complete all exercises with good form following initial instruction and min cueing for equal weight bearing and good knee alignment. Patient would continue to benefit from outpatient physical therapy to continue addressing the aforementioned deficits and continue to return to prior level of function and improve quality of life.     Rehab Potential  Fair    Clinical Impairments Affecting Rehab Potential  positive - injection helped reduce/change pain; negative - BMI, chronicity of left knee pain, PMH of chronic pain and fibromyalgia    PT Frequency  2x / week    PT Duration  6  weeks    PT Treatment/Interventions  Iontophoresis 4mg /ml Dexamethasone;Aquatic Therapy;Gait training;Stair training;Therapeutic activities;Therapeutic exercise;Balance training;Neuromuscular re-education;Patient/family education;Manual techniques;Passive range of motion;Dry needling;Energy conservation;Taping;Joint Manipulations    PT Next Visit Plan  bilateral LE strengthening and balance training advancement as able, VMO strengthening; add to HEP    PT Home Exercise Plan  initial - heel slides; 11/20: bridge, SLR, SLR wiht ER and knee drives; 56/38/7564 - 4-way hip       Patient will benefit from skilled therapeutic intervention in order to improve the following deficits and impairments:  Abnormal gait, Increased fascial restricitons, Pain, Decreased mobility, Decreased activity tolerance, Decreased endurance, Decreased range of motion, Decreased strength, Decreased balance, Difficulty walking  Visit Diagnosis: Stiffness of left knee, not elsewhere classified  Chronic pain of left knee  Muscle weakness (generalized)  Other symptoms and signs involving the musculoskeletal system  Other abnormalities of gait and mobility     Problem List Patient Active Problem List   Diagnosis Date Noted  . Fibroid 04/04/2017  . Encounter for gynecological examination with Papanicolaou smear of cervix 03/24/2017  . Dysmenorrhea 03/24/2017  . Menorrhagia with regular cycle 03/24/2017    Floria Raveling. Hartnett-Rands, MS, PT [facility] #[id number] 01/19/2018, 1:58 PM  [place], [place], [address] Phone: [phone]   Fax:  [phone]  Name: CINDIA HUSTEAD MRN: [id number] Date of Birth: 05-Mar-1970

## 2018-01-19 NOTE — Patient Instructions (Signed)
Hip Extension: 2-4 Inches    Tighten gluteal muscle. Lift one leg _10-15__ times. Restabilize pelvis. Repeat with other leg. Keep pelvis still. Be sure pelvis does not rotate and back does not arch. Do 1___ sets, _1__ times per day.  http://ss.exer.us/63   Copyright  VHI. All rights reserved.   Hip Adduction: Side-Lying (Single Leg)    Side-lying, lift straight. Raise bottom leg, keeping knee straight. Repeat 10-15__ times per set. Repeat with other leg. Do 1__ sets per session.  http://tub.exer.us/206   Copyright  VHI. All rights reserved.   Strengthening: Hip Abduction (Side-Lying)    Tighten muscles on front of left thigh, then lift leg _9-12___ inches from surface, keeping knee locked.  Repeat _10-15___ times per set. Do ___1_ sets per session. Do _1___ sessions per day.  http://orth.exer.us/623   Copyright  VHI. All rights reserved.

## 2018-01-27 ENCOUNTER — Encounter (HOSPITAL_COMMUNITY): Payer: Self-pay

## 2018-01-27 ENCOUNTER — Ambulatory Visit (HOSPITAL_COMMUNITY): Payer: Medicaid Other

## 2018-01-27 DIAGNOSIS — G8929 Other chronic pain: Secondary | ICD-10-CM

## 2018-01-27 DIAGNOSIS — M25662 Stiffness of left knee, not elsewhere classified: Secondary | ICD-10-CM

## 2018-01-27 DIAGNOSIS — R2689 Other abnormalities of gait and mobility: Secondary | ICD-10-CM

## 2018-01-27 DIAGNOSIS — M6281 Muscle weakness (generalized): Secondary | ICD-10-CM

## 2018-01-27 DIAGNOSIS — R29898 Other symptoms and signs involving the musculoskeletal system: Secondary | ICD-10-CM

## 2018-01-27 DIAGNOSIS — M25562 Pain in left knee: Secondary | ICD-10-CM

## 2018-01-27 NOTE — Therapy (Signed)
[place], [place], [address] Phone: [phone]   Fax:  [phone]  Physical Therapy Treatment  Patient Details  Name: Dawn Carter MRN: [id number] Date of Birth: October 04, 1970 Referring Provider (PT): Cassell Clement, MD   Encounter Date: 01/27/2018  PT End of Session - 01/27/18 1041    Visit Number  5    Number of Visits  12    Date for PT Re-Evaluation  02/08/18    Authorization Type  Medicaid    Authorization Time Period  Medicaid approval for 5 visits 12/10-->02/13/2018    Authorization - Visit Number  5    Authorization - Number of Visits  9    PT Start Time  0240    PT Stop Time  1113    PT Time Calculation (min)  40 min       Past Medical History:  Diagnosis Date  . Anxiety disorder   . Arthritis   . Asthma   . Back pain   . Bipolar 1 disorder ([place])   . COPD (chronic obstructive pulmonary disease) ([place])   . Depression   . Fibroid 04/04/2017  . Hypothyroidism   . Seizures ([place])    1 as child; unknown etiology and on no meds. No more seizures since then.  . Thyroid disease    hypothyroid    Past Surgical History:  Procedure Laterality Date  . CERVICAL CONE BIOPSY    . ENDOMETRIAL ABLATION N/A 05/18/2017   Procedure: ENDOMETRIAL ABLATION MINERVA;  Surgeon: Florian Buff, MD;  Location: [facility];  Service: Gynecology;  Laterality: N/A;  . HYSTEROSCOPY W/D&C N/A 05/18/2017   Procedure: DILATATION AND CURETTAGE /HYSTEROSCOPY;  Surgeon: Florian Buff, MD;  Location: [facility];  Service: Gynecology;  Laterality: N/A;  . LAPAROSCOPIC TUBAL LIGATION Bilateral 05/18/2017   Procedure: LAPAROSCOPIC BILATERAL TUBAL LIGATION;  Surgeon: Florian Buff, MD;  Location: [facility];  Service: Gynecology;  Laterality: Bilateral;    There were no vitals filed for this visit.  Subjective Assessment - 01/27/18 1031    Subjective  Pt stated she has had to move out out apt in one week, has been on her feet increased  time with increased edema and pain partially due to the cold weather.  Current pain scale 5/10.    Patient Stated Goals  Figure out why left knee hurts and learn how to fix it.    Currently in Pain?  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    Referring Provider (PT)  Cassell Clement, MD    Onset Date/Surgical Date  10/20/17    Hand Dominance  Right    Next MD Visit  Unscheduled    Prior Therapy  No      Precautions  Precautions  None                   [facility] Adult PT Treatment/Exercise - 01/27/18 0001      Knee/Hip Exercises: Machines for Strengthening   Cybex Leg Press  Bodycraft leg press 2x 15 2plates      Knee/Hip Exercises: Standing   Forward Lunges  1 set;10 reps;Both    Forward Lunges Limitations  on 4in step    Lateral Step Up  Left;10 reps;Step Height: 6"    Lateral Step Up Limitations  no c/o popping today    Functional Squat  10 reps    Functional Squat Limitations  front of chair, cueing for weight bearing    Wall Squat  10 reps;3 seconds    Wall Squat Limitations  ball between knees for VMO    Stairs  4RT 7in step height reciprocal pattern no HHA    SLS with Vectors  5x 5" holds    Other Standing Knee Exercises  side step RTB 2RT               PT Short Term Goals - 01/19/18 1354      PT SHORT TERM GOAL #1   Title  Patient will demonstrate 120 degrees of left knee AROM into flexion to improve her ability to normalize gait and go up and  down stairs.     Baseline  initial - 92 degrees    Time  2    Period  Weeks    Status  Achieved      PT SHORT TERM GOAL #2   Title  Patient will be independent in initial HEP and complete on a regular basis correctly.     Time  2    Period  Weeks    Status  Achieved        PT Long Term Goals - 01/19/18 1354      PT LONG TERM GOAL #1   Title  Patient will be independent in advanced HEP and perform on a regular basis to continue strength, balance and pain, and functional improvements and maintenance after discharge from PT.     Time  6    Period  Weeks    Status  On-going      PT LONG TERM GOAL #2   Title  Patient will exhibit full AROM of left knee from 0 to at least 130 degrees to exhibit reduced pain and improved functional abilities.     Baseline  initial - 1-92 degrees; 01/19/18 - 0-135    Time  6    Period  Weeks    Status  Achieved      PT LONG TERM GOAL #3   Title  Patient will be able to stand on either foot for > 20 seconds to indicate reduced fall risk and improved functional ability.     Baseline  initial - Rt 10 sec; Lt 3 sec; 01/19/18 - 30 seconds on either foot    Time  6    Period  Weeks    Status  Achieved      PT LONG TERM GOAL #4   Title  Patient will exhibit 1/2 grade improvement in MMT throughout bilateral LEs to indicate improved functional strength of bilateral LEs and reduce stresses on left knee.     Baseline  see MMT    Time  6    Period  Weeks    Status  Achieved  PT LONG TERM GOAL #5   Title  Patient to complain of no > than 2/10 pain in left knee for one week to indicate improved function, reduced pain, and decreased inflammation.     Baseline  01/19/18 - 5/10    Time  4    Period  Weeks    Status  New            Plan - 01/27/18 1113    Clinical Impression Statement  Session focus on VMO and functional strengthening.  Added exercises to hip/gluteal strengthening included leg press, sidestep with theraband resistance, functional  squats infront of chair and vector stance for balance and stability wiht SLS.  Pt required education and cueing for proper mechanics with squats to reduce stress on anterior knee.  EOS reports decrease in pain to 3/10 from 5/10 at entrance.    Rehab Potential  Fair    Clinical Impairments Affecting Rehab Potential  positive - injection helped reduce/change pain; negative - BMI, chronicity of left knee pain, PMH of chronic pain and fibromyalgia    PT Frequency  2x / week    PT Duration  6 weeks    PT Treatment/Interventions  Iontophoresis 4mg /ml Dexamethasone;Aquatic Therapy;Gait training;Stair training;Therapeutic activities;Therapeutic exercise;Balance training;Neuromuscular re-education;Patient/family education;Manual techniques;Passive range of motion;Dry needling;Energy conservation;Taping;Joint Manipulations    PT Next Visit Plan  bilateral LE strengthening and balance training advancement as able, VMO strengthening; add to HEP    PT Home Exercise Plan  initial - heel slides; 11/20: bridge, SLR, SLR wiht ER and knee drives; 89/38/1017 - 4-way hip       Patient will benefit from skilled therapeutic intervention in order to improve the following deficits and impairments:  Abnormal gait, Increased fascial restricitons, Pain, Decreased mobility, Decreased activity tolerance, Decreased endurance, Decreased range of motion, Decreased strength, Decreased balance, Difficulty walking  Visit Diagnosis: Stiffness of left knee, not elsewhere classified  Chronic pain of left knee  Muscle weakness (generalized)  Other symptoms and signs involving the musculoskeletal system  Other abnormalities of gait and mobility     Problem List Patient Active Problem List   Diagnosis Date Noted  . Fibroid 04/04/2017  . Encounter for gynecological examination with Papanicolaou smear of cervix 03/24/2017  . Dysmenorrhea 03/24/2017  . Menorrhagia with regular cycle 03/24/2017   Ihor Austin, [place];  [place]  Aldona Lento 01/27/2018, 12:24 PM  [place], [place], [address] Phone: [phone]   Fax:  [phone]  Name: Dawn Carter MRN: [id number] Date of Birth: 1970/08/10

## 2018-01-31 ENCOUNTER — Telehealth (HOSPITAL_COMMUNITY): Payer: Self-pay | Admitting: Internal Medicine

## 2018-01-31 NOTE — Telephone Encounter (Signed)
01/31/18  pt said that she needed to cancel this appt. but no reason was given

## 2018-02-01 ENCOUNTER — Encounter (HOSPITAL_COMMUNITY): Payer: Medicaid Other

## 2018-02-09 ENCOUNTER — Telehealth (HOSPITAL_COMMUNITY): Payer: Self-pay

## 2018-02-09 ENCOUNTER — Ambulatory Visit (HOSPITAL_COMMUNITY): Payer: Medicaid Other

## 2018-02-09 ENCOUNTER — Encounter (HOSPITAL_COMMUNITY): Payer: Self-pay

## 2018-02-09 NOTE — Telephone Encounter (Signed)
No Show: I called Dawn Carter about her 9:45 am appointment today that she did not arrive for. She stated she new she had the appointment but her Rt knee has been hurting too much to be able to participate in therapy today. She stated that she has madw an appointment with her MD, Dr. Michaelle Birks, to have her Rt knee checked out. I informed her this is her last approved appointment for Medicaid visits and that we would need to request more to continue in 2020. I discussed the benefit of following up with her MD and getting a referral for her Rt knee pain as well so we could evaluate and address this and continue treating her Lt knee. She agreed this would be an acceptable plane.  Kipp Brood, PT, DPT Physical Therapist with [facility]  02/09/2018 10:09 AM

## 2018-02-09 NOTE — Therapy (Signed)
[place], [place], [address] Phone: [phone]   Fax:  [phone]  Patient Details  Name: Dawn Carter MRN: [id number] Date of Birth: [birth year] Referring Provider:  No ref. provider found  Encounter Date: 02/09/2018  PHYSICAL THERAPY DISCHARGE SUMMARY  Visits from Start of Care: 5  Current functional level related to goals / functional outcomes: [name] did not arrive for her appointment today and has participated in 1/5 of her medicaid approved visits from 12/10-12/30. I spoke with her about her 9:45 am appointment today that she did not arrive and she stated she new she had the appointment but her Rt knee has been hurting too much to be able to participate in therapy today. She stated that she has made an appointment with her MD, Dr. Michaelle Birks, to have her Rt knee checked out. I informed her this is her last approved appointment for Medicaid visits and that we would need to request more to continue in 2020. I discussed the benefit of following up with her MD and getting a referral for her Rt knee pain as well so we could evaluate and address this and continue treating her Lt knee. She agreed this would be an acceptable plane. She will be discharged from current episode of care.   Remaining deficits: See goal status at last visit.  PT Short Term Goals - 01/19/18 1354            PT SHORT TERM GOAL #1   Title  Patient will demonstrate 120 degrees of left knee AROM into flexion to improve her ability to normalize gait and go up and down stairs.     Baseline  initial - 92 degrees    Time  2    Period  Weeks    Status  Achieved        PT SHORT TERM GOAL #2   Title  Patient will be independent in initial HEP and complete on a regular basis correctly.     Time  2    Period  Weeks    Status  Achieved           PT Long Term Goals - 01/19/18 1354            PT LONG TERM GOAL #1   Title   Patient will be independent in advanced HEP and perform on a regular basis to continue strength, balance and pain, and functional improvements and maintenance after discharge from PT.     Time  6    Period  Weeks    Status  On-going        PT LONG TERM GOAL #2   Title  Patient will exhibit full AROM of left knee from 0 to at least 130 degrees to exhibit reduced pain and improved functional abilities.     Baseline  initial - 1-92 degrees; 01/19/18 - 0-135    Time  6    Period  Weeks    Status  Achieved        PT LONG TERM GOAL #3   Title  Patient will be able to stand on either foot for > 20 seconds to indicate reduced fall risk and improved functional ability.     Baseline  initial - Rt 10 sec; Lt 3 sec; 01/19/18 - 30 seconds on either foot    Time  6    Period  Weeks    Status  Achieved  PT LONG TERM GOAL #4   Title  Patient will exhibit 1/2 grade improvement in MMT throughout bilateral LEs to indicate improved functional strength of bilateral LEs and reduce stresses on left knee.     Baseline  see MMT    Time  6    Period  Weeks    Status  Achieved        PT LONG TERM GOAL #5   Title  Patient to complain of no > than 2/10 pain in left knee for one week to indicate improved function, reduced pain, and decreased inflammation.     Baseline  01/19/18 - 5/10    Time  4    Period  Weeks    Status  New       Education / Equipment: Educated on [employer] throughout and on attendance policy and cancellation poilicy. Educated on plan to discharge from this episode of physical therapy and that patient can return with new referral.   Plan: Patient agrees to discharge.  Patient goals were partially met. Patient is being discharged due to                                                    Patient planning to follow up with MD about Rt knee pain and then may return with new referral.  ?????       Kipp Brood, PT, DPT Physical Therapist with [facility]  02/09/2018 10:14 AM    [place], [place], [address] Phone: [phone]   Fax:  [phone]

## 2018-02-16 ENCOUNTER — Encounter (HOSPITAL_COMMUNITY): Payer: Medicaid Other

## 2018-02-23 ENCOUNTER — Encounter (HOSPITAL_COMMUNITY): Payer: Medicaid Other

## 2018-03-02 ENCOUNTER — Encounter (HOSPITAL_COMMUNITY): Payer: Medicaid Other

## 2018-03-09 ENCOUNTER — Encounter (HOSPITAL_COMMUNITY): Payer: Medicaid Other

## 2018-03-16 ENCOUNTER — Encounter (HOSPITAL_COMMUNITY): Payer: Medicaid Other

## 2018-10-12 ENCOUNTER — Other Ambulatory Visit (HOSPITAL_COMMUNITY): Payer: Self-pay | Admitting: Nurse Practitioner

## 2018-10-12 ENCOUNTER — Ambulatory Visit (HOSPITAL_COMMUNITY)
Admission: RE | Admit: 2018-10-12 | Discharge: 2018-10-12 | Disposition: A | Discharge status: Home / Self Care | Payer: Medicaid Other | Source: Ambulatory Visit | Attending: Nurse Practitioner | Admitting: Nurse Practitioner

## 2018-10-12 ENCOUNTER — Other Ambulatory Visit: Payer: Self-pay

## 2018-10-12 DIAGNOSIS — M545 Low back pain, unspecified: Secondary | ICD-10-CM

## 2018-10-12 DIAGNOSIS — M542 Cervicalgia: Secondary | ICD-10-CM | POA: Insufficient documentation

## 2019-06-18 ENCOUNTER — Ambulatory Visit: Payer: Medicaid Other | Admitting: Neurology

## 2019-06-18 ENCOUNTER — Other Ambulatory Visit: Payer: Self-pay

## 2019-06-18 ENCOUNTER — Encounter: Payer: Self-pay | Admitting: Neurology

## 2019-06-18 ENCOUNTER — Telehealth: Payer: Self-pay | Admitting: Neurology

## 2019-06-18 VITALS — BP 108/72 | HR 73 | Temp 97.3°F | Ht 68.0 in | Wt 240.5 lb

## 2019-06-18 DIAGNOSIS — G8929 Other chronic pain: Secondary | ICD-10-CM

## 2019-06-18 DIAGNOSIS — M545 Low back pain, unspecified: Secondary | ICD-10-CM

## 2019-06-18 DIAGNOSIS — R202 Paresthesia of skin: Secondary | ICD-10-CM | POA: Diagnosis not present

## 2019-06-18 DIAGNOSIS — M542 Cervicalgia: Secondary | ICD-10-CM | POA: Diagnosis not present

## 2019-06-18 MED ORDER — DULOXETINE HCL 60 MG PO CPEP: 60.0000 mg | ORAL_CAPSULE | Freq: Every day | ORAL | 11 refills | Status: AC

## 2019-06-18 NOTE — Telephone Encounter (Signed)
medicaid order sent to [facility]. They will obtain the auth and reach out to the patient to schedule.  °

## 2019-06-18 NOTE — Progress Notes (Signed)
PATIENT: Dawn Carter DOB: [birth year]  Chief Complaint  Patient presents with  . Numbness    Reports numbness/tingling in hands and feet. She has failed gabapentin. She is currently taking duloxetine 60mg , one capsule daily.   Marland Kitchen PCP    Dolores Patty, NP     HISTORICAL  Dawn Carter is a 49 year old female, seen in request by her primary care nurse practitioner Dolores Patty, for evaluation of chronic neck pain, intermittent bilateral upper and lower extremity paresthesia, initial evaluation was on Jun 18, 2019.  I have reviewed and summarized the referring note from the referring physician.  She has past medical history of hypertension, presented with chronic neck pain for more than 5 years  She describes her neck pain radiating to bilateral shoulder, also bilateral upper extremity, more on the right side, with intermittent bilateral fourth and fifth finger numbness tingling, more on the right side, subjective weakness of bilateral upper extremity, could no longer hold any objects more than 10 times.  She also complains of gradual onset bilateral feet numbness tingling, plantar surface, and toes, she also complains of long history of low back pain, radiating pain to bilateral lower extremity.  She denies gait abnormality, with cough, she has intermittent stress incontinence, she also taking Seroquel low-dose for sleep, otherwise her feet and upper extremity paresthesia often preventing him into sleep  Over the years, she was treated with different medications, currently taking Cymbalta 60 mg daily which has been helpful, previously tried gabapentin without help, Lyrica works better     REVIEW OF SYSTEMS: Full 14 system review of systems performed and notable only for as above All other review of systems were negative.  ALLERGIES: Allergies  Allergen Reactions  . Buprenorphine Hcl Palpitations  . Chantix [Varenicline] Anaphylaxis, Hives, Itching and Rash  . Coconut  Fatty Acids Anaphylaxis  . Percocet [Oxycodone-Acetaminophen] Hives and Itching    Can take if premedicated with benadryl    HOME MEDICATIONS: Current Outpatient Medications  Medication Sig Dispense Refill  . albuterol (PROVENTIL HFA;VENTOLIN HFA) 108 (90 BASE) MCG/ACT inhaler Inhale 2 puffs into the lungs every 6 (six) hours as needed for wheezing or shortness of breath. 1 Inhaler 2  . albuterol (PROVENTIL) (2.5 MG/3ML) 0.083% nebulizer solution Take 2.5 mg by nebulization every 6 (six) hours as needed for Wheezing.    Marland Kitchen b complex vitamins tablet Take 2 tablets by mouth. Takes 2 tab three times weekly    . calcium carbonate (TUMS - DOSED IN MG ELEMENTAL CALCIUM) 500 MG chewable tablet Chew 2 tablets by mouth daily as needed for indigestion or heartburn.    . cholecalciferol (VITAMIN D) 1000 units tablet Take 2,000 Units by mouth daily.    . DULoxetine (CYMBALTA) 60 MG capsule Take 60 mg by mouth daily.    Marland Kitchen EPINEPHrine (EPIPEN 2-PAK) 0.3 mg/0.3 mL IJ SOAJ injection Inject 0.3 mLs (0.3 mg total) into the muscle once. 2 Device 1  . fluticasone (FLONASE) 50 MCG/ACT nasal spray Place 1 spray into both nostrils daily as needed for allergies.     . hydrochlorothiazide (HYDRODIURIL) 25 MG tablet Take 25 mg by mouth daily.    Marland Kitchen HYDROcodone-acetaminophen (NORCO) 10-325 MG tablet Take 1 tablet by mouth every 6 (six) hours as needed.    . Magnesium 500 MG TABS Take 500 mg by mouth daily.    . polyethylene glycol (MIRALAX / GLYCOLAX) packet Take 17 g by mouth daily as needed for moderate constipation.    Marland Kitchen  Potassium 99 MG TABS Take 198 mg by mouth daily.    Marland Kitchen SPIRIVA HANDIHALER 18 MCG inhalation capsule Place 1 capsule into inhaler and inhale daily.    Marland Kitchen tiZANidine (ZANAFLEX) 4 MG tablet Take 8 mg by mouth at bedtime.     . valACYclovir (VALTREX) 1000 MG tablet Take 1,000 mg by mouth 3 (three) times daily.     No current facility-administered medications for this visit.    PAST MEDICAL  HISTORY: Past Medical History:  Diagnosis Date  . Anxiety disorder   . Arthritis   . Asthma   . Back pain   . Bipolar 1 disorder ([place])   . COPD (chronic obstructive pulmonary disease) ([place])   . Depression   . Fibroid 04/04/2017  . Hypothyroidism   . Numbness and tingling   . Seizures ([place])    1 as child; unknown etiology and on no meds. No more seizures since then.  . Thyroid disease    hypothyroid    PAST SURGICAL HISTORY: Past Surgical History:  Procedure Laterality Date  . CERVICAL CONE BIOPSY    . ENDOMETRIAL ABLATION N/A 05/18/2017   Procedure: ENDOMETRIAL ABLATION MINERVA;  Surgeon: Florian Buff, MD;  Location: [facility];  Service: Gynecology;  Laterality: N/A;  . HYSTEROSCOPY WITH D & C N/A 05/18/2017   Procedure: DILATATION AND CURETTAGE /HYSTEROSCOPY;  Surgeon: Florian Buff, MD;  Location: [facility];  Service: Gynecology;  Laterality: N/A;  . LAPAROSCOPIC TUBAL LIGATION Bilateral 05/18/2017   Procedure: LAPAROSCOPIC BILATERAL TUBAL LIGATION;  Surgeon: Florian Buff, MD;  Location: [facility];  Service: Gynecology;  Laterality: Bilateral;    FAMILY HISTORY: Family History  Problem Relation Age of Onset  . Hypertension Paternal Grandfather   . Hypertension Paternal Grandmother   . Diabetes Maternal Grandmother   . Heart attack Maternal Grandmother   . Hypertension Maternal Grandmother   . Hypertension Maternal Grandfather   . Cancer Father        prostate  . Diabetes Mother   . Hypertension Mother   . Heart attack Mother   . Hypertension Brother   . Kidney disease Brother   . Hypertension Sister     SOCIAL HISTORY: Social History   Socioeconomic History  . Marital status: Single    Spouse name: Not on file  . Number of children: 6  . Years of education: GED  . Highest education level: Not on file  Occupational History  . Occupation: Unemployment    Comment: currently seeking disability  Tobacco Use  . Smoking status: Current Every Day Smoker    Packs/day:  0.50    Years: 20.00    Pack years: 10.00    Types: Cigarettes  . Smokeless tobacco: Never Used  Substance and Sexual Activity  . Alcohol use: Yes    Comment: Two 12oz cans per month  . Drug use: Yes    Types: Marijuana    Comment: "4 nulls per month"  . Sexual activity: Not Currently    Birth control/protection: Surgical    Comment: tubal and ablation  Other Topics Concern  . Not on file  Social History Narrative   Lives at home with two of her children.   Right-handed.   2L soda per day.   Social Determinants of Health   Financial Resource Strain:   . Difficulty of Paying Living Expenses:   Food Insecurity:   . Worried About Charity fundraiser in the Last Year:   . [employer]  in the Last Year:   Transportation Needs:   . Film/video editor (Medical):   Marland Kitchen Lack of Transportation (Non-Medical):   Physical Activity:   . Days of Exercise per Week:   . Minutes of Exercise per Session:   Stress:   . Feeling of Stress :   Social Connections:   . Frequency of Communication with Friends and Family:   . Frequency of Social Gatherings with Friends and Family:   . Attends Religious Services:   . Active Member of Clubs or Organizations:   . Attends Archivist Meetings:   Marland Kitchen Marital Status:   Intimate Partner Violence:   . Fear of Current or Ex-Partner:   . Emotionally Abused:   Marland Kitchen Physically Abused:   . Sexually Abused:      PHYSICAL EXAM   Vitals:   06/18/19 0714  BP: 108/72  Pulse: 73  Temp: (!) 97.3 F (36.3 C)  Weight: 240 lb 8 oz (109.1 kg)  Height: 5\' 8"  (1.727 m)    Not recorded      Body mass index is 36.57 kg/m.  PHYSICAL EXAMNIATION:  Gen: NAD, conversant, well nourised, well groomed                     Cardiovascular: Regular rate rhythm, no peripheral edema, warm, nontender. Eyes: Conjunctivae clear without exudates or hemorrhage Neck: Supple, no carotid bruits. Pulmonary: Clear to auscultation bilaterally   NEUROLOGICAL  EXAM:  MENTAL STATUS: Speech:    Speech is normal; fluent and spontaneous with normal comprehension.  Cognition:     Orientation to time, place and person     Normal recent and remote memory     Normal Attention span and concentration     Normal Language, naming, repeating,spontaneous speech     Fund of knowledge   CRANIAL NERVES: CN II: Visual fields are full to confrontation. Pupils are round equal and briskly reactive to light. CN III, IV, VI: extraocular movement are normal. No ptosis. CN V: Facial sensation is intact to light touch CN VII: Face is symmetric with normal eye closure  CN VIII: Hearing is normal to causal conversation. CN IX, X: Phonation is normal. CN XI: Head turning and shoulder shrug are intact  MOTOR: Limited range of motion of right shoulder, there was no significant bilateral upper or lower extremity proximal distal muscle weakness,  REFLEXES: Reflexes are hypoactive and symmetric at the biceps, triceps, knees, and absent at ankles. Plantar responses are flexor.  SENSORY: Intact to light touch, pinprick and vibratory sensation are intact in fingers and toes.  COORDINATION: There is no trunk or limb dysmetria noted.  GAIT/STANCE: She can get up from seated position arms crossed, mildly antalgic, cautious gait  DIAGNOSTIC DATA (LABS, IMAGING, TESTING) - I reviewed patient records, labs, notes, testing and imaging myself where available.   ASSESSMENT AND PLAN  Eun KATESHA PACKETT is a 49 y.o. female   Chronic neck pain, radiating pain to bilateral shoulder, right worse than left, bilateral hands paresthesia, mainly involving bilateral fourth and fifth fingers,  Chronic low back pain, radiating pain to bilateral lower extremity, bilateral feet paresthesia, urinary incontinence,   Differentiation diagnosis include peripheral neuropathy, chronic lumbar radiculopathy, with superimposed chronic cervical radiculopathy,   MRI of cervical spine, lumbar  spine,  Laboratory evaluations for potential etiology  She responded well to Cymbalta, refill 60 mg daily  Previously tried gabapentin with limited help, Lyrica works better, her symptoms is under reasonable control with  current combination of Cymbalta 60 mg in the morning, and low-dose of Seroquel at nighttime for sleep  Marcial Pacas, M.D. Ph.D.  [facility] [address] [place], [place] [address] Ph: [phone] Fax: [phone]  CC: Dolores Patty, NP

## 2019-06-19 ENCOUNTER — Telehealth: Payer: Self-pay | Admitting: Neurology

## 2019-06-19 DIAGNOSIS — R202 Paresthesia of skin: Secondary | ICD-10-CM

## 2019-06-19 LAB — CBC WITH DIFFERENTIAL
Basophils Absolute: 0 10*3/uL (ref 0.0–0.2)
Basos: 0 %
EOS (ABSOLUTE): 0.2 10*3/uL (ref 0.0–0.4)
Eos: 3 %
Hematocrit: 39.4 % (ref 34.0–46.6)
Hemoglobin: 12.6 g/dL (ref 11.1–15.9)
Immature Grans (Abs): 0 10*3/uL (ref 0.0–0.1)
Immature Granulocytes: 0 %
Lymphocytes Absolute: 3.1 10*3/uL (ref 0.7–3.1)
Lymphs: 42 %
MCH: 27.5 pg (ref 26.6–33.0)
MCHC: 32 g/dL (ref 31.5–35.7)
MCV: 86 fL (ref 79–97)
Monocytes Absolute: 0.5 10*3/uL (ref 0.1–0.9)
Monocytes: 6 %
Neutrophils Absolute: 3.5 10*3/uL (ref 1.4–7.0)
Neutrophils: 49 %
RBC: 4.58 x10E6/uL (ref 3.77–5.28)
RDW: 15.3 % (ref 11.7–15.4)
WBC: 7.3 10*3/uL (ref 3.4–10.8)

## 2019-06-19 LAB — COMPREHENSIVE METABOLIC PANEL
ALT: 5 IU/L (ref 0–32)
AST: 9 IU/L (ref 0–40)
Albumin/Globulin Ratio: 1.4 (ref 1.2–2.2)
Albumin: 4.2 g/dL (ref 3.8–4.8)
Alkaline Phosphatase: 100 IU/L (ref 39–117)
BUN/Creatinine Ratio: 8 — ABNORMAL LOW (ref 9–23)
BUN: 6 mg/dL (ref 6–24)
Bilirubin Total: <0.2 mg/dL (ref 0.0–1.2)
CO2: 26 mmol/L (ref 20–29)
Calcium: 9.9 mg/dL (ref 8.7–10.2)
Chloride: 94 mmol/L — ABNORMAL LOW (ref 96–106)
Creatinine, Ser: 0.77 mg/dL (ref 0.57–1.00)
GFR calc Af Amer: 105 mL/min/{1.73_m2} (ref >59)
GFR calc non Af Amer: 91 mL/min/{1.73_m2} (ref >59)
Globulin, Total: 2.9 g/dL (ref 1.5–4.5)
Glucose: 97 mg/dL (ref 65–99)
Potassium: 3.5 mmol/L (ref 3.5–5.2)
Sodium: 136 mmol/L (ref 134–144)
Total Protein: 7.1 g/dL (ref 6.0–8.5)

## 2019-06-19 LAB — RPR: RPR Ser Ql: NONREACTIVE

## 2019-06-19 LAB — HGB A1C W/O EAG: Hgb A1c MFr Bld: 6.4 % — ABNORMAL HIGH (ref 4.8–5.6)

## 2019-06-19 LAB — TSH: TSH: 0.94 u[IU]/mL (ref 0.450–4.500)

## 2019-06-19 LAB — SEDIMENTATION RATE: Sed Rate: 101 mm/hr — ABNORMAL HIGH (ref 0–32)

## 2019-06-19 LAB — ANA W/REFLEX IF POSITIVE: Anti Nuclear Antibody (ANA): NEGATIVE

## 2019-06-19 LAB — CK: Total CK: 154 U/L (ref 32–182)

## 2019-06-19 LAB — FOLATE: Folate: <2 ng/mL — ABNORMAL LOW (ref >3.0)

## 2019-06-19 LAB — VITAMIN B12: Vitamin B-12: 823 pg/mL (ref 232–1245)

## 2019-06-19 LAB — C-REACTIVE PROTEIN: CRP: 37 mg/L — ABNORMAL HIGH (ref 0–10)

## 2019-06-19 NOTE — Telephone Encounter (Addendum)
I spoke to the patient. She verbalized understanding of her lab results. She will follow up w/ PCP concerning her elevated A1C level. Says she will start taking a multi-vitamin w/ folic acid. She was also in agreement to return to our office for further lab testing. She was provided with our operating hours.

## 2019-06-19 NOTE — Telephone Encounter (Signed)
Please call patient, laboratory evaluation showed elevated A1c 6.4, consistent with the diagnosis of diabetes, I have forward laboratory evaluations to her primary care Dolores Patty, NP she should contact her for further evaluation and treatment option, at the same time, she should start diet, exercise.  There is also evidence of significantly elevated ESR 101, C-reactive protein 37, I have ordered repeat laboratory evaluation in our system, she needs to come back for repeat test.  Folic acid was mildly decreased less than 2, she should start over-the-counter folic acid  or multivitamin supplement  Rest of the laboratory evaluation showed no significant abnormality.

## 2019-07-09 ENCOUNTER — Ambulatory Visit: Payer: Medicaid Other

## 2019-07-09 DIAGNOSIS — Z23 Encounter for immunization: Secondary | ICD-10-CM

## 2019-07-09 NOTE — Progress Notes (Signed)
   Covid-19 Vaccination Clinic  Name:  Dawn Carter    MRN: [id number] DOB: 11-22-1970  07/09/2019  [name] was observed post Covid-19 immunization for 30 minutes based on pre-vaccination screening without incident. She was provided with Vaccine Information Sheet and instruction to access the V-Safe system.   [name] was instructed to call 911 with any severe reactions post vaccine: Marland Kitchen Difficulty breathing  . Swelling of face and throat  . A fast heartbeat  . A bad rash all over body  . Dizziness and weakness   Immunizations Administered    Name Date Dose VIS Date Route   Moderna COVID-19 Vaccine 07/09/2019  2:49 PM 0.5 mL 01/2019 Intramuscular   Manufacturer: Moderna   Lot: [id number]   [id number]

## 2019-07-14 ENCOUNTER — Other Ambulatory Visit: Payer: Medicaid Other

## 2019-08-06 ENCOUNTER — Ambulatory Visit: Payer: Medicaid Other

## 2019-08-06 DIAGNOSIS — Z23 Encounter for immunization: Secondary | ICD-10-CM

## 2019-08-06 NOTE — Progress Notes (Signed)
   Covid-19 Vaccination Clinic  Name:  Dawn Carter    MRN: [id number] DOB: December 24, 1970  08/06/2019  Ms. Plate was observed post Covid-19 immunization for 15 minutes without incident. She was provided with Vaccine Information Sheet and instruction to access the V-Safe system.   [name] was instructed to call 911 with any severe reactions post vaccine: Marland Kitchen Difficulty breathing  . Swelling of face and throat  . A fast heartbeat  . A bad rash all over body  . Dizziness and weakness   Immunizations Administered    Name Date Dose VIS Date Route   Moderna COVID-19 Vaccine 08/06/2019  3:08 PM 0.5 mL 01/2019 Intramuscular   Manufacturer: Moderna   Lot: [id number]   [place]: [id number]

## 2019-09-19 ENCOUNTER — Ambulatory Visit: Payer: Self-pay

## 2019-10-09 ENCOUNTER — Other Ambulatory Visit: Payer: Medicaid Other

## 2019-10-23 ENCOUNTER — Ambulatory Visit: Payer: Medicaid Other | Admitting: Neurology

## 2019-10-31 ENCOUNTER — Ambulatory Visit
Admission: RE | Admit: 2019-10-31 | Discharge: 2019-10-31 | Disposition: A | Discharge status: Home / Self Care | Payer: Medicaid Other | Source: Ambulatory Visit | Attending: Neurology | Admitting: Neurology

## 2019-10-31 ENCOUNTER — Other Ambulatory Visit: Payer: Self-pay

## 2019-10-31 DIAGNOSIS — M545 Low back pain, unspecified: Secondary | ICD-10-CM

## 2019-10-31 DIAGNOSIS — M542 Cervicalgia: Secondary | ICD-10-CM

## 2019-10-31 DIAGNOSIS — G8929 Other chronic pain: Secondary | ICD-10-CM

## 2019-10-31 DIAGNOSIS — R202 Paresthesia of skin: Secondary | ICD-10-CM

## 2019-11-01 ENCOUNTER — Telehealth: Payer: Self-pay | Admitting: Neurology

## 2019-11-01 NOTE — Telephone Encounter (Signed)
IMPRESSION:   MRI cervical spine (without) demonstrating: - At C5-6 left uncovertebral joint hypertrophy with left foraminal stenosis.  IMPRESSION:   MRI lumbar spine (without) demonstrating mild degenerative changes and rotoscoliosis as above. No spinal stenosis or foraminal narrowing.  Please call patient, MRI of cervical spine showed degenerative changes, most obvious C5-6, there is evidence of left foraminal stenosis,  MRI of lumbar spine showed evidence of scoliosis convex at L2, multilevel degenerative changes, but there was no significant canal or foraminal narrowing

## 2019-11-01 NOTE — Telephone Encounter (Addendum)
Called patient, informed her the MRI of cervical spine showed degenerative changes, most obvious C5-6, there is evidence of left foraminal stenosis.  MRI of lumbar spine showed evidence of scoliosis convex at L2, multilevel degenerative changes, but there was no significant canal or foraminal narrowing. Patient verbalized understanding, appreciation.

## 2019-11-19 NOTE — Progress Notes (Signed)
PATIENT: Dawn Carter DOB: 1970/05/01  REASON FOR VISIT: follow up HISTORY FROM: patient  HISTORY OF PRESENT ILLNESS: Today 11/20/19  HISTORY Dawn Carter is a 49 year old female, seen in request by her primary care nurse practitioner Dolores Patty, for evaluation of chronic neck pain, intermittent bilateral upper and lower extremity paresthesia, initial evaluation was on Jun 18, 2019.  I have reviewed and summarized the referring note from the referring physician.  She has past medical history of hypertension, presented with chronic neck pain for more than 5 years  She describes her neck pain radiating to bilateral shoulder, also bilateral upper extremity, more on the right side, with intermittent bilateral fourth and fifth finger numbness tingling, more on the right side, subjective weakness of bilateral upper extremity, could no longer hold any objects more than 10 times.  She also complains of gradual onset bilateral feet numbness tingling, plantar surface, and toes, she also complains of long history of low back pain, radiating pain to bilateral lower extremity.  She denies gait abnormality, with cough, she has intermittent stress incontinence, she also taking Seroquel low-dose for sleep, otherwise her feet and upper extremity paresthesia often preventing him into sleep  Over the years, she was treated with different medications, currently taking Cymbalta 60 mg daily which has been helpful, previously tried gabapentin without help, Lyrica works better  Update November 20, 2019 SS: Laboratory evaluation revealed elevated A1c 6.4, elevated ESR 160, CRP 37, folic acid mildly decreased less than two, started over-the-counter multivitamin  MRI of cervical spine showed degenerative changes, most obvious C5-6, there is evidence of left foraminal stenosis.  MRI of lumbar spine showed evidence of scoliosis convex at L2, multilevel degenerative changes, but there was no  significant canal or foraminal narrowing.  Continues with neck pain, radiates down both arms, more on left than right, shooting pain, into fourth and fifth fingers, chronic low back pain, radiates down both legs, mostly in the left leg, with prolonged walking, reportedly the entire left side arm and leg will go numb.  No falls.  She is on hydrocodone for chronic neck, back, left knee pain, PCP closed the practice, trying to find a new PCP, asked for rx for hydrocodone here.  Takes Seroquel as needed for sleep.  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  REVIEW OF SYSTEMS: Out of a complete 14 system review of symptoms, the patient complains only of the following symptoms, and all other reviewed systems are negative.  Neck pain, back pain  ALLERGIES: Allergies  Allergen Reactions  . Buprenorphine Hcl Palpitations  . Chantix [Varenicline] Anaphylaxis, Hives, Itching and Rash  . Coconut Fatty Acids Anaphylaxis  . Peanut-Containing Drug Products Anaphylaxis  . Percocet [Oxycodone-Acetaminophen] Hives and Itching    Can take if premedicated with benadryl    HOME MEDICATIONS: Outpatient Medications Prior to Visit  Medication Sig Dispense Refill  . albuterol (PROVENTIL HFA;VENTOLIN HFA) 108 (90 BASE) MCG/ACT inhaler Inhale 2 puffs into the lungs every 6 (six) hours as needed for wheezing or shortness of breath. 1 Inhaler 2  . albuterol (PROVENTIL) (2.5 MG/3ML) 0.083% nebulizer solution Take 2.5 mg by nebulization every 6 (six) hours as needed for Wheezing.    Marland Kitchen b complex vitamins tablet Take 2 tablets by mouth. Takes 2 tab three times weekly    . calcium carbonate (TUMS - DOSED IN MG ELEMENTAL CALCIUM) 500 MG chewable tablet Chew 2 tablets by mouth daily as needed for  indigestion or heartburn.    . cholecalciferol (VITAMIN D) 1000 units tablet Take 2,000 Units by mouth daily.    . DULoxetine (CYMBALTA) 60 MG capsule Take 1 capsule (60 mg total) by mouth daily. 30 capsule  11  . EPINEPHrine (EPIPEN 2-PAK) 0.3 mg/0.3 mL IJ SOAJ injection Inject 0.3 mLs (0.3 mg total) into the muscle once. 2 Device 1  . fluticasone (FLONASE) 50 MCG/ACT nasal spray Place 1 spray into both nostrils daily as needed for allergies.     . hydrochlorothiazide (HYDRODIURIL) 25 MG tablet Take 25 mg by mouth daily.    Marland Kitchen SPIRIVA HANDIHALER 18 MCG inhalation capsule Place 1 capsule into inhaler and inhale daily.    Marland Kitchen tiZANidine (ZANAFLEX) 4 MG tablet Take 8 mg by mouth at bedtime.     . valACYclovir (VALTREX) 1000 MG tablet Take 1,000 mg by mouth 3 (three) times daily.    Marland Kitchen HYDROcodone-acetaminophen (NORCO) 10-325 MG tablet Take 1 tablet by mouth every 6 (six) hours as needed.    . Magnesium 500 MG TABS Take 500 mg by mouth daily.    . Multiple Vitamin (MULTIVITAMIN) tablet Take 1 tablet by mouth daily.    . polyethylene glycol (MIRALAX / GLYCOLAX) packet Take 17 g by mouth daily as needed for moderate constipation.    . Potassium 99 MG TABS Take 198 mg by mouth daily.     No facility-administered medications prior to visit.    PAST MEDICAL HISTORY: Past Medical History:  Diagnosis Date  . Anxiety disorder   . Arthritis   . Asthma   . Back pain   . Bipolar 1 disorder ([place])   . COPD (chronic obstructive pulmonary disease) ([place])   . Depression   . Fibroid 04/04/2017  . Hypothyroidism   . Numbness and tingling   . Seizures ([place])    1 as child; unknown etiology and on no meds. No more seizures since then.  . Thyroid disease    hypothyroid    PAST SURGICAL HISTORY: Past Surgical History:  Procedure Laterality Date  . CERVICAL CONE BIOPSY    . ENDOMETRIAL ABLATION N/A 05/18/2017   Procedure: ENDOMETRIAL ABLATION MINERVA;  Surgeon: Florian Buff, MD;  Location: [facility];  Service: Gynecology;  Laterality: N/A;  . HYSTEROSCOPY WITH D & C N/A 05/18/2017   Procedure: DILATATION AND CURETTAGE /HYSTEROSCOPY;  Surgeon: Florian Buff, MD;  Location: [facility];  Service: Gynecology;  Laterality:  N/A;  . LAPAROSCOPIC TUBAL LIGATION Bilateral 05/18/2017   Procedure: LAPAROSCOPIC BILATERAL TUBAL LIGATION;  Surgeon: Florian Buff, MD;  Location: [facility];  Service: Gynecology;  Laterality: Bilateral;    FAMILY HISTORY: Family History  Problem Relation Age of Onset  . Hypertension Paternal Grandfather   . Hypertension Paternal Grandmother   . Diabetes Maternal Grandmother   . Heart attack Maternal Grandmother   . Hypertension Maternal Grandmother   . Hypertension Maternal Grandfather   . Cancer Father        prostate  . Diabetes Mother   . Hypertension Mother   . Heart attack Mother   . Hypertension Brother   . Kidney disease Brother   . Hypertension Sister     SOCIAL HISTORY: Social History   Socioeconomic History  . Marital status: Single    Spouse name: Not on file  . Number of children: 6  . Years of education: GED  . Highest education level: Not on file  Occupational History  . Occupation: Unemployment    Comment: currently  seeking disability  Tobacco Use  . Smoking status: Current Every Day Smoker    Packs/day: 0.50    Years: 20.00    Pack years: 10.00    Types: Cigarettes  . Smokeless tobacco: Never Used  Vaping Use  . Vaping Use: Never used  Substance and Sexual Activity  . Alcohol use: Yes    Comment: Two 12oz cans per month  . Drug use: Yes    Types: Marijuana    Comment: "4 nulls per month"  . Sexual activity: Not Currently    Birth control/protection: Surgical    Comment: tubal and ablation  Other Topics Concern  . Not on file  Social History Narrative   Lives at home with two of her children.   Right-handed.   2L soda per day.   Social Determinants of Health   Financial Resource Strain:   . Difficulty of Paying Living Expenses: Not on file  Food Insecurity:   . Worried About Charity fundraiser in the Last Year: Not on file  . Ran Out of Food in the Last Year: Not on file  Transportation Needs:   . Lack of Transportation (Medical):  Not on file  . Lack of Transportation (Non-Medical): Not on file  Physical Activity:   . Days of Exercise per Week: Not on file  . Minutes of Exercise per Session: Not on file  Stress:   . Feeling of Stress : Not on file  Social Connections:   . Frequency of Communication with Friends and Family: Not on file  . Frequency of Social Gatherings with Friends and Family: Not on file  . Attends Religious Services: Not on file  . Active Member of Clubs or Organizations: Not on file  . Attends Archivist Meetings: Not on file  . Marital Status: Not on file  Intimate Partner Violence:   . Fear of Current or Ex-Partner: Not on file  . Emotionally Abused: Not on file  . Physically Abused: Not on file  . Sexually Abused: Not on file   PHYSICAL EXAM  Vitals:   11/20/19 1418  BP: 118/84  Pulse: 70  Weight: 237 lb 12.8 oz (107.9 kg)  Height: _0  (1.727 m)   Body mass index is 36.16 kg/m.  Generalized: Well developed, in no acute distress   Neurological examination  Mentation: Alert oriented to time, place, history taking. Follows all commands speech and language fluent Cranial nerve II-XII: Pupils were equal round reactive to light. Extraocular movements were full, visual field were full on confrontational test. Facial sensation and strength were normal. Head turning and shoulder shrug  were normal and symmetric. Motor: No significant upper or lower extremity weakness, pain to left knee Sensory: Sensory testing is intact to soft touch on all 4 extremities. No evidence of extinction is noted.  Coordination: Cerebellar testing reveals good finger-nose-finger and heel-to-shin bilaterally.  Gait and station: Gait is normal.  Reflexes: Deep tendon reflexes are symmetric and normal bilaterally.   DIAGNOSTIC DATA (LABS, IMAGING, TESTING) - I reviewed patient records, labs, notes, testing and imaging myself where available.  Lab Results  Component Value Date   WBC 7.3 06/18/2019     HGB 12.6 06/18/2019   HCT 39.4 06/18/2019   MCV 86 06/18/2019   PLT 256 05/13/2017      Component Value Date/Time   NA 136 06/18/2019 0816   K 3.5 06/18/2019 0816   CL 94 (L) 06/18/2019 0816   CO2 26 06/18/2019 0816  GLUCOSE 97 06/18/2019 0816   GLUCOSE 116 (H) 05/13/2017 1400   BUN 6 06/18/2019 0816   CREATININE 0.77 06/18/2019 0816   CALCIUM 9.9 06/18/2019 0816   PROT 7.1 06/18/2019 0816   ALBUMIN 4.2 06/18/2019 0816   AST 9 06/18/2019 0816   ALT 5 06/18/2019 0816   ALKPHOS 100 06/18/2019 0816   BILITOT <0.2 06/18/2019 0816   GFRNONAA 91 06/18/2019 0816   GFRAA 105 06/18/2019 0816   No results found for: CHOL, HDL, LDLCALC, LDLDIRECT, TRIG, CHOLHDL Lab Results  Component Value Date   HGBA1C 6.4 (H) 06/18/2019   Lab Results  Component Value Date   VITAMINB12 823 06/18/2019   Lab Results  Component Value Date   TSH 0.940 06/18/2019      ASSESSMENT AND PLAN 49 y.o. year old female  has a past medical history of Anxiety disorder, Arthritis, Asthma, Back pain, Bipolar 1 disorder ([place]), COPD (chronic obstructive pulmonary disease) ([place]), Depression, Fibroid (04/04/2017), Hypothyroidism, Numbness and tingling, Seizures ([place]), and Thyroid disease. here with:  1.  Chronic neck pain, radiating pain to bilateral shoulders, left (originally right) worse than left, bilateral hand paresthesia, mainly involving bilateral fourth and fifth fingers (on left) 2.  Chronic low back pain, radiating pain to bilateral lower extremities, bilateral feet paresthesia, urinary incontinence  -Laboratory evaluation revealed elevated A1c 6.4, significantly elevated ESR 354, CRP 37, folic acid was mildly decreased less than 2, on over-the-counter supplement-repeat test were ordered back in May-we will get this today (sed rate, CRP, ANA, Sjogren's, ANCA)  -MRI cervical spine showed degenerative changes, most obvious at C5-6, there is evidence of left foraminal stenosis.  -MRI lumbar spine  showed evidence of scoliosis convex at L2, multilevel degenerative changes, no significant canal or foraminal narrowing  -Given, continued symptoms, which are chronic, evidence of left foraminal stenosis by MRI cervical spine at C5-6, will order NCV/EMG both uppers, left lower extremity  -Asked for prescription for hydrocodone, did not give this, offered to assist with finding a new primary doctor, will do on her own, on Cymbalta and hydrocodone has reasonable control of pain, limited benefit with gabapentin/Lyrica  -Follow-up in 6 months or sooner if needed, may cancel follow-up depending on results of NCV/EMG  I spent 30 minutes of face-to-face and non-face-to-face time with patient.  This included previsit chart review, lab review, study review, order entry, electronic health record documentation, patient education.  Butler Denmark, AGNP-C, DNP 11/20/2019, 3:01 PM [facility] [address] [place], [place] [address] [phone]

## 2019-11-20 ENCOUNTER — Other Ambulatory Visit: Payer: Self-pay

## 2019-11-20 ENCOUNTER — Encounter: Payer: Self-pay | Admitting: Neurology

## 2019-11-20 ENCOUNTER — Ambulatory Visit: Payer: Medicaid Other | Admitting: Neurology

## 2019-11-20 VITALS — BP 118/84 | HR 70 | Ht 68.0 in | Wt 237.8 lb

## 2019-11-20 DIAGNOSIS — M545 Low back pain, unspecified: Secondary | ICD-10-CM

## 2019-11-20 DIAGNOSIS — R202 Paresthesia of skin: Secondary | ICD-10-CM | POA: Diagnosis not present

## 2019-11-20 DIAGNOSIS — G8929 Other chronic pain: Secondary | ICD-10-CM | POA: Diagnosis not present

## 2019-11-20 DIAGNOSIS — M542 Cervicalgia: Secondary | ICD-10-CM | POA: Diagnosis not present

## 2019-11-20 NOTE — Patient Instructions (Signed)
Need to check the blood work today  Will order nerve conduction study with Dr. Krista Blue  Follow-up in 6 months

## 2019-11-21 ENCOUNTER — Telehealth: Payer: Self-pay | Admitting: Neurology

## 2019-11-21 LAB — SEDIMENTATION RATE: Sed Rate: 66 mm/hr — ABNORMAL HIGH (ref 0–32)

## 2019-11-21 LAB — SJOGRENS SYNDROME-A EXTRACTABLE NUCLEAR ANTIBODY: ENA SSA (RO) Ab: <0.2 AI (ref 0.0–0.9)

## 2019-11-21 LAB — ANA W/REFLEX IF POSITIVE: Anti Nuclear Antibody (ANA): NEGATIVE

## 2019-11-21 LAB — C-REACTIVE PROTEIN: CRP: 39 mg/L — ABNORMAL HIGH (ref 0–10)

## 2019-11-21 LAB — SJOGRENS SYNDROME-B EXTRACTABLE NUCLEAR ANTIBODY: ENA SSB (LA) Ab: <0.2 AI (ref 0.0–0.9)

## 2019-11-21 MED ORDER — PREDNISONE 10 MG PO TABS: ORAL_TABLET | ORAL | 1 refills | Status: AC

## 2019-11-21 NOTE — Addendum Note (Signed)
Addended by: Suzzanne Cloud on: 11/21/2019 04:21 PM   Modules accepted: Orders

## 2019-11-21 NOTE — Telephone Encounter (Signed)
Laboratory evaluation showed significantly elevated ESR 66, C-reactive protein 39,  This could indicate systemic inflammatory process, if she has diffuse body achy pain, muscle weakness, differentiation diagnosis including polymyalgia rheumatica, may consider low-dose prednisone treatment, such as 20 mg daily for 1 week, and 10 mg daily, she has EMG appointment pending on Dec 19 2019

## 2019-11-21 NOTE — Telephone Encounter (Signed)
See Dr. Rhea Belton earlier note, have her start prednisone 20 mg daily x 1 week, then 10 mg daily, stay on 10 mg until NCV/EMG 12/19/19, looking for improvement in achy pain. I see no mention of DM.

## 2019-11-21 NOTE — Telephone Encounter (Signed)
I called pt and relayed that SS/NP ordered prednisone 20mg  po daily for 0ne week and then 10mg  po daily until [place]/EMG done.  She does not have diabetes.  SE increased energy, appetite, and can given insomnia.  She has taken before is aware.  She verbalized udnerstanding. Sent to [employer].

## 2019-12-19 ENCOUNTER — Ambulatory Visit (INDEPENDENT_AMBULATORY_CARE_PROVIDER_SITE_OTHER): Payer: Medicaid Other | Admitting: Neurology

## 2019-12-19 ENCOUNTER — Other Ambulatory Visit: Payer: Self-pay

## 2019-12-19 ENCOUNTER — Ambulatory Visit: Payer: Medicaid Other | Admitting: Neurology

## 2019-12-19 DIAGNOSIS — G8929 Other chronic pain: Secondary | ICD-10-CM

## 2019-12-19 DIAGNOSIS — M545 Low back pain, unspecified: Secondary | ICD-10-CM

## 2019-12-19 DIAGNOSIS — R202 Paresthesia of skin: Secondary | ICD-10-CM

## 2019-12-19 DIAGNOSIS — M542 Cervicalgia: Secondary | ICD-10-CM

## 2019-12-19 DIAGNOSIS — Z0289 Encounter for other administrative examinations: Secondary | ICD-10-CM

## 2019-12-19 NOTE — Progress Notes (Signed)
I have reviewed and agreed above plan. 

## 2019-12-19 NOTE — Procedures (Signed)
Full Name: Dawn Carter Gender: Female MRN #: [id number] Date of Birth: 1970/12/04    Visit Date: 12/19/2019 07:20 Age: 49 Years Examining Physician: Marcial Pacas, MD  Referring Physician: Butler Denmark, NP Height: 5 feet 8 inch Patient History: 237lbs History: 49 year old female, presented with intermittent bilateral upper extremity paresthesia, radiating pain,  Summary of the test: Nerve conduction study: Left sural, superficial peroneal, median and ulnar sensory responses were normal.  Left tibial, peroneal to EDB, median and ulnar motor responses were normal.  Electromyography: Selected needle examination of left lower extremity muscles were normal.  She could not tolerate more extensive needle examinations.  Conclusion: This is a normal study.  There is no electrodiagnostic evidence of left upper or lower extremity neuropathy    ------------------------------- Marcial Pacas M.D. PhD  [facility] [address], [place], [place] [address] Tel: [phone] Fax: [phone]  Verbal informed consent was obtained from the patient, patient was informed of potential risk of procedure, including bruising, bleeding, hematoma formation, infection, muscle weakness, muscle pain, numbness, among others.        [name]    Nerve / Sites Muscle Latency Ref. Amplitude Ref. Rel Amp Segments Distance Velocity Ref. Area    ms ms mV mV %  cm m/s m/s mVms  L Median - APB     Wrist APB 3.6 ?4.4 9.3 ?4.0 100 Wrist - APB 7   26.2     Upper arm APB 7.9  7.7  81.9 Upper arm - Wrist 23 53 ?49 21.3  L Ulnar - ADM     Wrist ADM 2.4 ?3.3 10.6 ?6.0 100 Wrist - ADM 7   31.2     B.Elbow ADM 6.2  9.3  87.2 B.Elbow - Wrist 21 55 ?49 29.6     A.Elbow ADM 8.0  9.4  101 A.Elbow - B.Elbow 10 55 ?49 30.4  L Peroneal - EDB     Ankle EDB 3.5 ?6.5 8.9 ?2.0 100 Ankle - EDB 9   22.1     Fib head EDB 9.6  6.7  76 Fib head - Ankle 28 46 ?44 19.2     Pop fossa EDB 11.8  6.7  99 Pop  fossa - Fib head 10 46 ?44 19.1         Pop fossa - Ankle      L Tibial - AH     Ankle AH 4.5 ?5.8 11.8 ?4.0 100 Ankle - AH 9   18.5     Pop fossa AH 13.2  6.9  58.5 Pop fossa - Ankle 40 46 ?41 13.4             SNC    Nerve / Sites Rec. Site Peak Lat Ref.  Amp Ref. Segments Distance Peak Diff Ref.    ms ms V V  cm ms ms  L Sural - Ankle (Calf)     Calf Ankle 3.9 ?4.4 9 ?6 Calf - Ankle 14    L Superficial, median ulnar sensory responses were normal.- Ankle     Lat leg Ankle 3.7 ?4.4 8 ?6 Lat leg - Ankle 14    L Median, Ulnar - Transcarpal comparison     Median Palm Wrist 2.1 ?2.2 49 ?35 Median Palm - Wrist 8       Ulnar Palm Wrist 1.8 ?2.2 10 ?12 Ulnar Palm - Wrist 8          Median Palm - [employer]  0.3 ?0.4  L Median - Orthodromic (Dig II, Mid palm)     Dig II Wrist 3.0 ?3.4 15 ?10 Dig II - Wrist 13    L Ulnar - Orthodromic, (Dig V, Mid palm)     Dig V Wrist 2.3 ?3.1 7 ?5 Dig V - Wrist 76                 F  Wave    Nerve F Lat Ref.   ms ms  L Tibial - AH 55.2 ?56.0  L Ulnar - ADM 28.8 ?32.0         EMG Summary Table    Spontaneous MUAP Recruitment  Muscle IA Fib PSW Fasc Other Amp Dur. Poly Pattern  L. Tibialis anterior Normal None None None _______ Normal Normal Normal Normal  L. Tibialis posterior Normal None None None _______ Normal Normal Normal Normal  L. Peroneus longus Normal None None None _______ Normal Normal Normal Normal  L. Gastrocnemius (Medial head) Normal None None None _______ Normal Normal Normal Normal  L. Gastrocnemius (Lateral head) Normal None None None _______ Normal Normal Normal Normal

## 2020-04-21 ENCOUNTER — Other Ambulatory Visit: Payer: Self-pay | Admitting: Endocrinology

## 2020-04-21 DIAGNOSIS — Z1231 Encounter for screening mammogram for malignant neoplasm of breast: Secondary | ICD-10-CM

## 2020-04-25 ENCOUNTER — Ambulatory Visit
Admission: RE | Admit: 2020-04-25 | Discharge: 2020-04-25 | Disposition: A | Discharge status: Home / Self Care | Payer: Medicaid Other | Source: Ambulatory Visit | Attending: Endocrinology | Admitting: Endocrinology

## 2020-04-25 ENCOUNTER — Other Ambulatory Visit: Payer: Self-pay

## 2020-04-25 DIAGNOSIS — Z1231 Encounter for screening mammogram for malignant neoplasm of breast: Secondary | ICD-10-CM

## 2020-04-29 ENCOUNTER — Other Ambulatory Visit: Payer: Self-pay | Admitting: Endocrinology

## 2020-04-29 DIAGNOSIS — R928 Other abnormal and inconclusive findings on diagnostic imaging of breast: Secondary | ICD-10-CM

## 2020-05-16 ENCOUNTER — Other Ambulatory Visit: Payer: Medicaid Other

## 2020-05-22 ENCOUNTER — Ambulatory Visit: Payer: Medicaid Other | Admitting: Neurology

## 2020-06-04 ENCOUNTER — Ambulatory Visit: Payer: Medicaid Other

## 2020-06-04 ENCOUNTER — Other Ambulatory Visit: Payer: Self-pay

## 2020-06-04 ENCOUNTER — Ambulatory Visit
Admission: RE | Admit: 2020-06-04 | Discharge: 2020-06-04 | Disposition: A | Discharge status: Home / Self Care | Payer: Medicaid Other | Source: Ambulatory Visit | Attending: Endocrinology | Admitting: Endocrinology

## 2020-06-04 DIAGNOSIS — R928 Other abnormal and inconclusive findings on diagnostic imaging of breast: Secondary | ICD-10-CM

## 2020-12-08 ENCOUNTER — Ambulatory Visit: Payer: Medicaid Other | Admitting: Skilled Nursing Facility1

## 2022-08-06 IMAGING — MG MM DIGITAL SCREENING BILAT W/ TOMO AND CAD
6 of 10 series · 6 of 30 positions shown · non-contrast
Comparison: Previous exam(s).

CLINICAL DATA: Screening.

EXAM:
DIGITAL SCREENING BILATERAL MAMMOGRAM WITH TOMOSYNTHESIS AND CAD
TECHNIQUE: Bilateral screening digital craniocaudal and mediolateral oblique
mammograms were obtained. Bilateral screening digital breast
tomosynthesis was performed. The images were evaluated with
computer-aided detection.

[L MLO synth-2D]
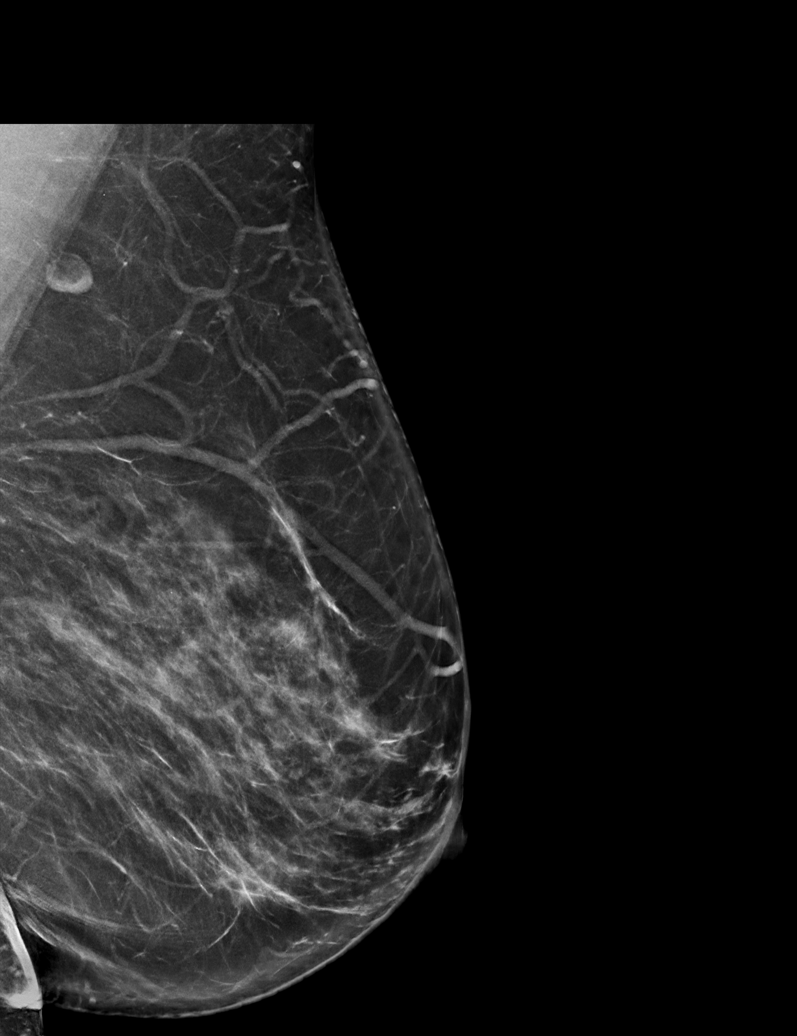

[L CC synth-2D (1 of 2)]
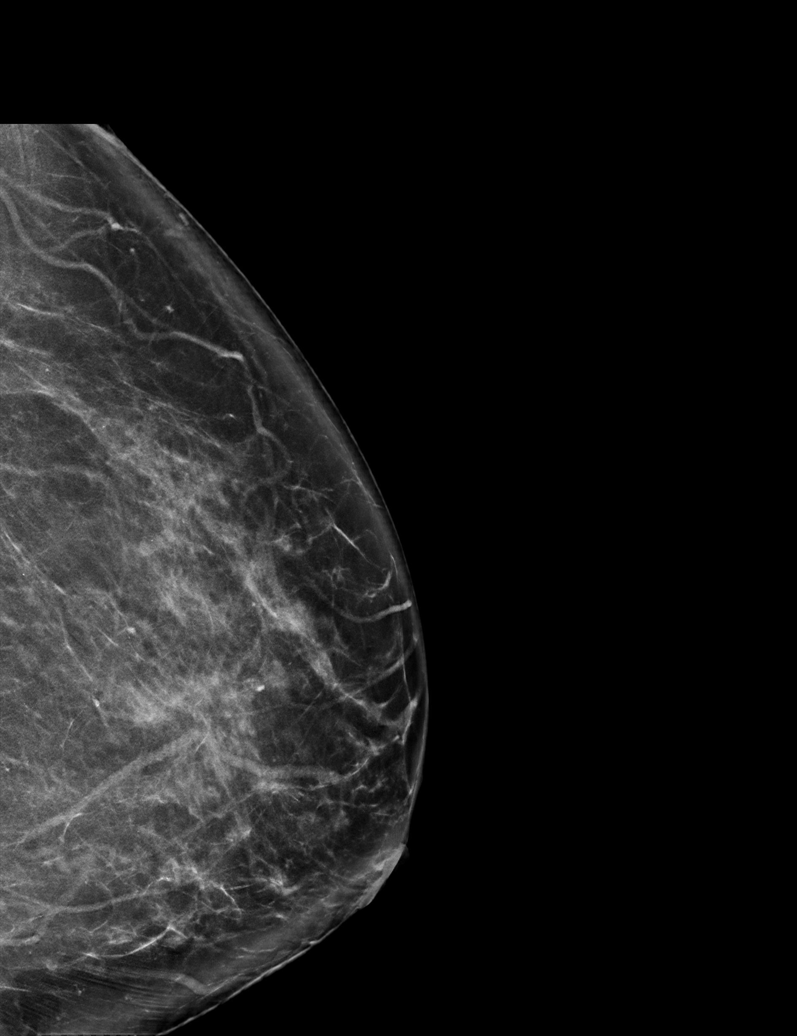

[R MLO synth-2D]
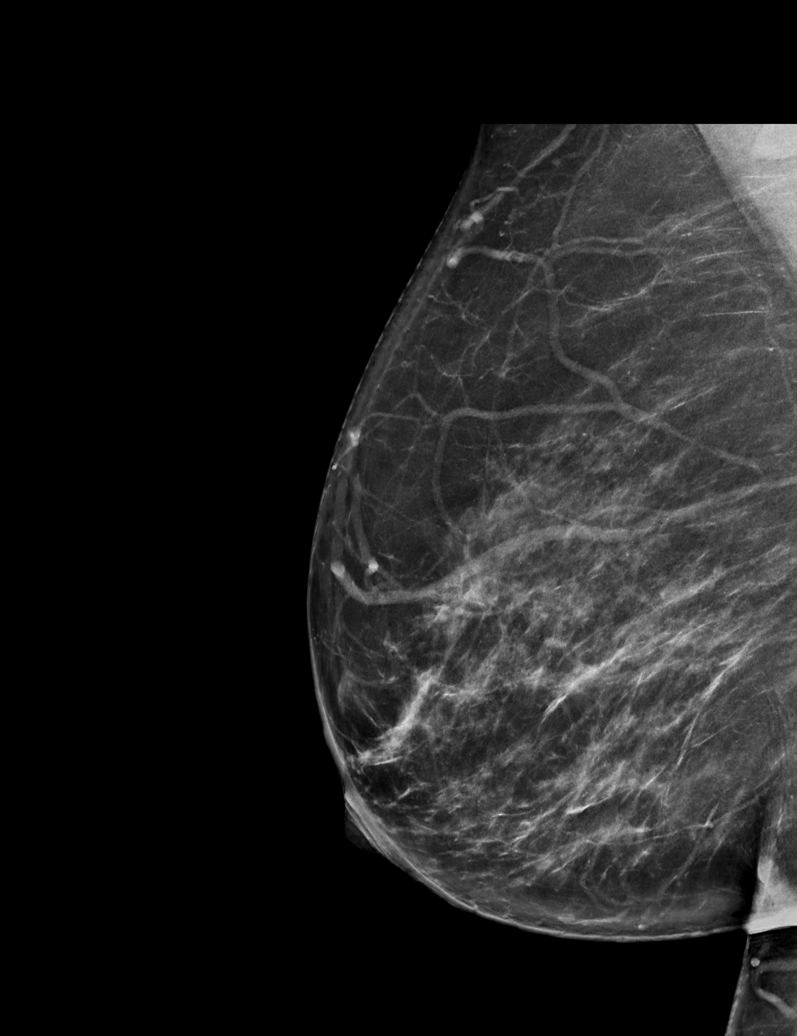

[R CC synth-2D]
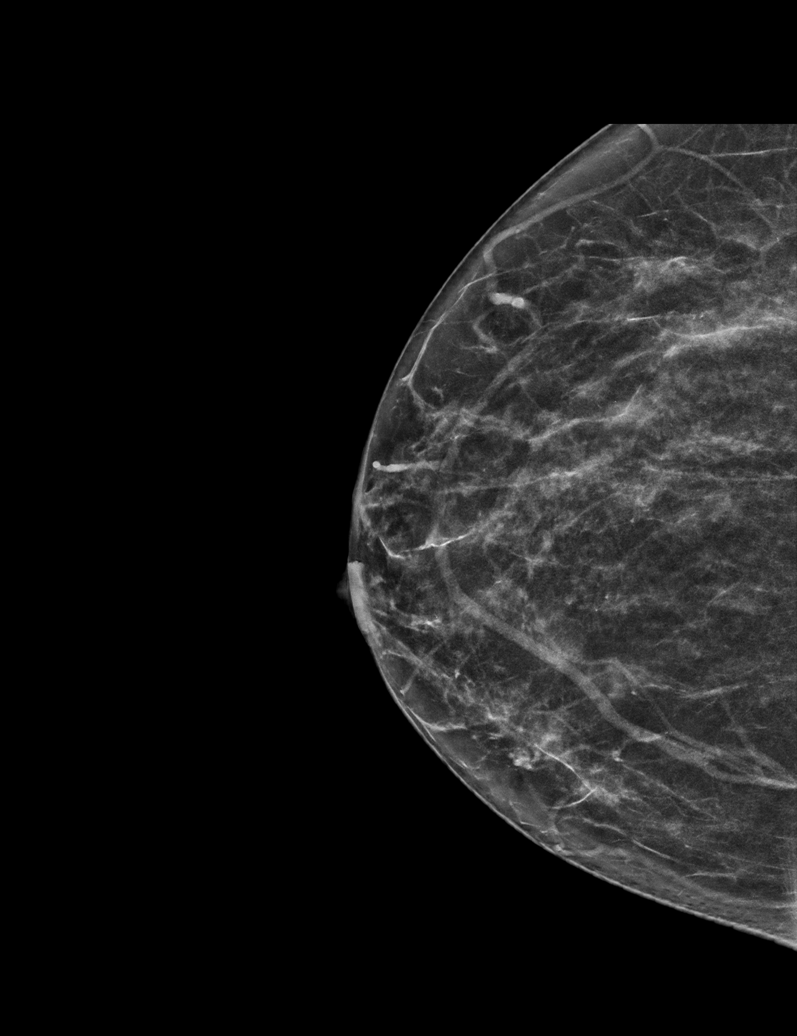

[L CC synth-2D (2 of 2)]
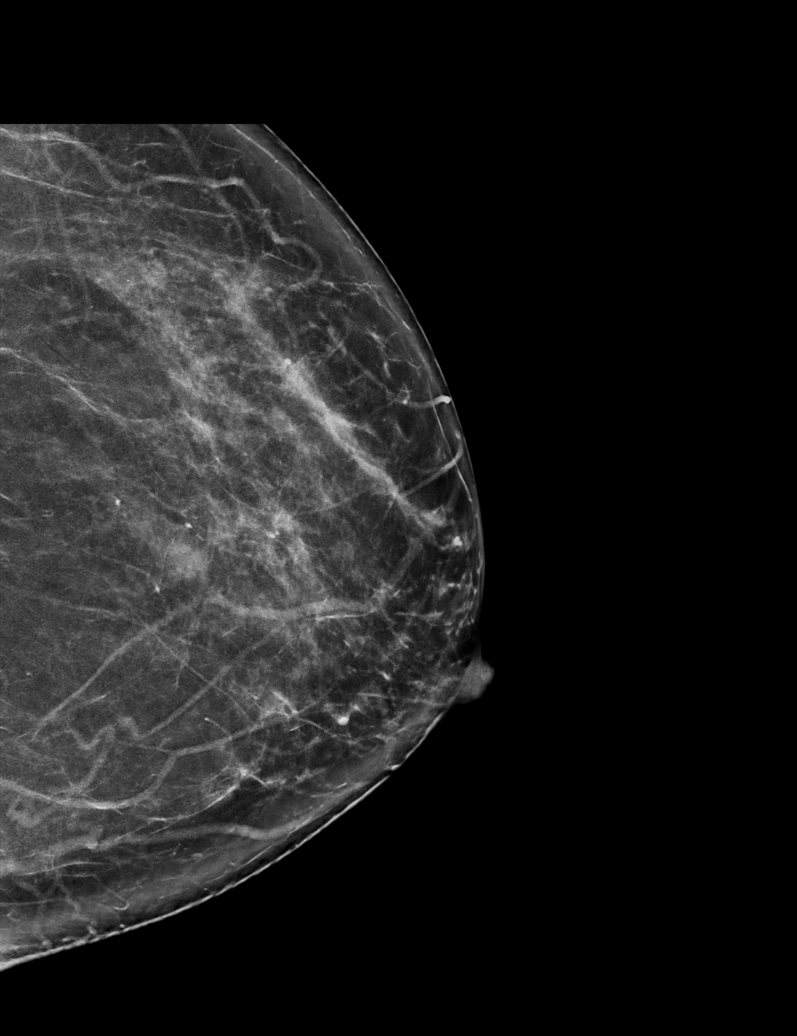

[L CC tomo · tomo slice 39/78.0]
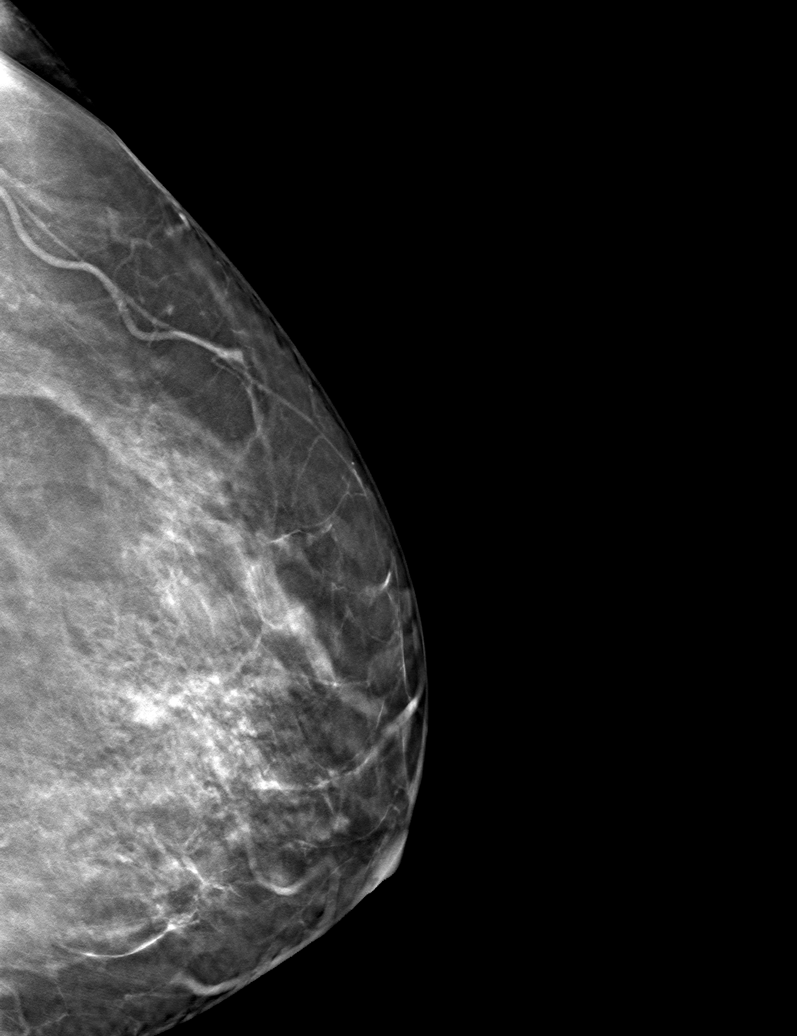

[6 of 30 positions shown; findings below may reference images not displayed]

ACR Breast Density Category b: There are scattered areas of
fibroglandular density.
FINDINGS: In the left breast, a possible asymmetry warrants further
evaluation. In the right breast, no findings suspicious for
malignancy.
IMPRESSION: Further evaluation is suggested for possible asymmetry in the left
breast.

RECOMMENDATION:
Diagnostic mammogram and possibly ultrasound of the left breast.
(Code:[id number])

The patient will be contacted regarding the findings, and additional
imaging will be scheduled.

BI-RADS CATEGORY  0: Incomplete. Need additional imaging evaluation
and/or prior mammograms for comparison.

## 2022-12-11 LAB — GLUCOSE, POCT (MANUAL RESULT ENTRY): POC Glucose: 109 mg/dL — AB (ref 70–99)

## 2022-12-14 NOTE — Congregational Nurse Program (Unsigned)
Health screening event at [facility] housing authority on Saturday October 26,2024. Written consent signed to obtain blood pressure and glucose finger stick.  Client states takes her BP medication at night and took meds last night. No follow up needed.
